# Patient Record
Sex: Female | Born: 1955 | Race: White | Hispanic: No | Marital: Single | Smoking: Current every day smoker
Health system: Southern US, Community
[De-identification: ages and names within clinical notes are randomized; demographics above are authoritative.]

## PROBLEM LIST (undated history)

## (undated) DIAGNOSIS — J45909 Unspecified asthma, uncomplicated: Secondary | ICD-10-CM

## (undated) DIAGNOSIS — R1310 Dysphagia, unspecified: Secondary | ICD-10-CM

## (undated) DIAGNOSIS — M329 Systemic lupus erythematosus, unspecified: Secondary | ICD-10-CM

## (undated) DIAGNOSIS — T8859XA Other complications of anesthesia, initial encounter: Secondary | ICD-10-CM

## (undated) DIAGNOSIS — Z87442 Personal history of urinary calculi: Secondary | ICD-10-CM

## (undated) DIAGNOSIS — IMO0002 Reserved for concepts with insufficient information to code with codable children: Secondary | ICD-10-CM

## (undated) DIAGNOSIS — R42 Dizziness and giddiness: Secondary | ICD-10-CM

## (undated) DIAGNOSIS — M069 Rheumatoid arthritis, unspecified: Secondary | ICD-10-CM

## (undated) DIAGNOSIS — F419 Anxiety disorder, unspecified: Secondary | ICD-10-CM

## (undated) DIAGNOSIS — J449 Chronic obstructive pulmonary disease, unspecified: Secondary | ICD-10-CM

## (undated) DIAGNOSIS — F32A Depression, unspecified: Secondary | ICD-10-CM

## (undated) DIAGNOSIS — I10 Essential (primary) hypertension: Secondary | ICD-10-CM

## (undated) HISTORY — PX: HEEL SPUR SURGERY: SHX665

## (undated) HISTORY — PX: LUMBAR FUSION: SHX111

## (undated) HISTORY — PX: CERVICAL FUSION: SHX112

## (undated) HISTORY — PX: CHOLECYSTECTOMY: SHX55

## (undated) HISTORY — PX: CATARACT EXTRACTION: SUR2

## (undated) HISTORY — PX: ROTATOR CUFF REPAIR: SHX139

---

## 1988-03-31 DIAGNOSIS — C539 Malignant neoplasm of cervix uteri, unspecified: Secondary | ICD-10-CM

## 1988-03-31 HISTORY — DX: Malignant neoplasm of cervix uteri, unspecified: C53.9

## 1988-03-31 HISTORY — PX: CERVICAL ABLATION: SHX5771

## 2002-03-31 DIAGNOSIS — I469 Cardiac arrest, cause unspecified: Secondary | ICD-10-CM

## 2002-03-31 HISTORY — DX: Cardiac arrest, cause unspecified: I46.9

## 2012-11-05 ENCOUNTER — Emergency Department: Payer: Self-pay | Admitting: Emergency Medicine

## 2012-11-14 ENCOUNTER — Emergency Department: Payer: Self-pay | Admitting: Internal Medicine

## 2012-11-28 ENCOUNTER — Emergency Department: Payer: Self-pay | Admitting: Emergency Medicine
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
## 2014-11-10 DIAGNOSIS — F418 Other specified anxiety disorders: Secondary | ICD-10-CM | POA: Insufficient documentation

## 2020-05-17 ENCOUNTER — Emergency Department: Payer: Medicaid - Out of State

## 2020-05-17 ENCOUNTER — Other Ambulatory Visit: Payer: Self-pay

## 2020-05-17 ENCOUNTER — Emergency Department
Admission: EM | Admit: 2020-05-17 | Discharge: 2020-05-17 | Disposition: A | Discharge status: Home / Self Care | Payer: Medicaid - Out of State | Attending: Emergency Medicine | Admitting: Emergency Medicine

## 2020-05-17 DIAGNOSIS — F17200 Nicotine dependence, unspecified, uncomplicated: Secondary | ICD-10-CM | POA: Insufficient documentation

## 2020-05-17 DIAGNOSIS — R42 Dizziness and giddiness: Secondary | ICD-10-CM

## 2020-05-17 DIAGNOSIS — I10 Essential (primary) hypertension: Secondary | ICD-10-CM | POA: Diagnosis not present

## 2020-05-17 HISTORY — DX: Essential (primary) hypertension: I10

## 2020-05-17 HISTORY — DX: Reserved for concepts with insufficient information to code with codable children: IMO0002

## 2020-05-17 HISTORY — DX: Dizziness and giddiness: R42

## 2020-05-17 HISTORY — DX: Systemic lupus erythematosus, unspecified: M32.9

## 2020-05-17 LAB — CBC
HCT: 41 % (ref 36.0–46.0)
Hemoglobin: 14.4 g/dL (ref 12.0–15.0)
MCH: 31.4 pg (ref 26.0–34.0)
MCHC: 35.1 g/dL (ref 30.0–36.0)
MCV: 89.5 fL (ref 80.0–100.0)
Platelets: 183 10*3/uL (ref 150–400)
RBC: 4.58 MIL/uL (ref 3.87–5.11)
RDW: 14.9 % (ref 11.5–15.5)
WBC: 5.7 10*3/uL (ref 4.0–10.5)
nRBC: 0 % (ref 0.0–0.2)

## 2020-05-17 LAB — URINALYSIS, COMPLETE (UACMP) WITH MICROSCOPIC
Bacteria, UA: NONE SEEN
Bilirubin Urine: NEGATIVE
Glucose, UA: NEGATIVE mg/dL
Hgb urine dipstick: NEGATIVE
Ketones, ur: NEGATIVE mg/dL
Leukocytes,Ua: NEGATIVE
Nitrite: NEGATIVE
Protein, ur: 30 mg/dL — AB
Specific Gravity, Urine: 1.02 (ref 1.005–1.030)
pH: 6 (ref 5.0–8.0)

## 2020-05-17 LAB — BASIC METABOLIC PANEL
Anion gap: 6 (ref 5–15)
BUN: 22 mg/dL (ref 8–23)
CO2: 25 mmol/L (ref 22–32)
Calcium: 9.3 mg/dL (ref 8.9–10.3)
Chloride: 106 mmol/L (ref 98–111)
Creatinine, Ser: 1.13 mg/dL — ABNORMAL HIGH (ref 0.44–1.00)
GFR, Estimated: 55 mL/min — ABNORMAL LOW (ref >60)
Glucose, Bld: 115 mg/dL — ABNORMAL HIGH (ref 70–99)
Potassium: 4.3 mmol/L (ref 3.5–5.1)
Sodium: 137 mmol/L (ref 135–145)

## 2020-05-17 MED ORDER — SODIUM CHLORIDE 0.9 % IV BOLUS
1000.0000 mL | Freq: Once | INTRAVENOUS | Status: AC
  Administered 2020-05-17: 1000 mL via INTRAVENOUS

## 2020-05-17 MED ORDER — MECLIZINE HCL 25 MG PO TABS: 25.0000 mg | ORAL_TABLET | Freq: Three times a day (TID) | ORAL | 1 refills | Status: DC | PRN

## 2020-05-17 NOTE — ED Provider Notes (Signed)
[facility] Emergency Department Provider Note  ____________________________________________  Time seen: Approximately 2:42 PM  I have reviewed the triage vital signs and the nursing notes.   HISTORY  Chief Complaint Dizziness    HPI Melanie Hubbard is a 65 y.o. female with a history of hypertension lupus and vertigo who comes ED complaining of dizziness and change in balance over the last 2 or 3 days, waxing waning, worse with standing and first getting out of bed in the morning, no alleviating factors.  Denies palpitations chest pain shortness of breath headache vision changes paresthesias or motor weakness.   She is normally on enalapril for blood pressure, has not taken it in the last few days.  Recently moved to this area from [place], does not have a PCP yet because she is waiting for Medicaid to be transferred.   Past Medical History:  Diagnosis Date  . Hypertension   . Lupus ([place])   . Vertigo      There are no problems to display for this patient.    History reviewed. No pertinent surgical history.   Prior to Admission medications   Medication Sig Start Date End Date Taking? Authorizing Provider  meclizine (ANTIVERT) 25 MG tablet Take 1 tablet (25 mg total) by mouth 3 (three) times daily as needed for dizziness or nausea. 05/17/20  Yes Carrie Mew, MD     Allergies Patient has no known allergies.   No family history on file.  Social History Social History   Tobacco Use  . Smoking status: Current Every Day Smoker    Packs/day: 0.50  . Smokeless tobacco: Never Used    Review of Systems  Constitutional:   No fever or chills.  ENT:   No sore throat. No rhinorrhea. Cardiovascular:   No chest pain or syncope. Respiratory:   No dyspnea or cough. Gastrointestinal:   Negative for abdominal pain, vomiting and diarrhea.  Musculoskeletal:   Negative for focal pain or swelling All other systems reviewed and are negative except  as documented above in ROS and HPI.  ____________________________________________   PHYSICAL EXAM:  VITAL SIGNS: ED Triage Vitals  Enc Vitals Group     BP 05/17/20 1138 (!) 138/112     Pulse Rate 05/17/20 1138 65     Resp 05/17/20 1138 16     Temp 05/17/20 1138 98.2 F (36.8 C)     Temp Source 05/17/20 1138 Oral     SpO2 05/17/20 1138 98 %     Weight 05/17/20 1137 250 lb (113.4 kg)     Height 05/17/20 1137 5' (1.524 m)     Head Circumference --      Peak Flow --      Pain Score 05/17/20 1137 0     Pain Loc --      Pain Edu? --      Excl. in [place]? --     Vital signs reviewed, nursing assessments reviewed.   Constitutional:   Alert and oriented. Non-toxic appearance. Eyes:   Conjunctivae are normal. EOMI. PERRL.  No nystagmus ENT      Head:   Normocephalic and atraumatic.      Nose:   Wearing a mask.      Mouth/Throat:   Wearing a mask.      Neck:   No meningismus. Full ROM. Hematological/Lymphatic/Immunilogical:   No cervical lymphadenopathy. Cardiovascular:   RRR. Symmetric bilateral radial and DP pulses.  No murmurs. Cap refill less than 2 seconds. Respiratory:  Normal respiratory effort without tachypnea/retractions. Breath sounds are clear and equal bilaterally. No wheezes/rales/rhonchi. Gastrointestinal:   Soft and nontender. Non distended. There is no CVA tenderness.  No rebound, rigidity, or guarding.  Musculoskeletal:   Normal range of motion in all extremities. No joint effusions.  No lower extremity tenderness.  No edema. Neurologic:   Normal speech and language.  Motor grossly intact. Slightly off balance with ambulation and turning Skin:    Skin is warm, dry and intact. No rash noted.  No petechiae, purpura, or bullae.  ____________________________________________    LABS (pertinent positives/negatives) (all labs ordered are listed, but only abnormal results are displayed) Labs Reviewed  BASIC METABOLIC PANEL - Abnormal; Notable for the following  components:      Result Value   Glucose, Bld 115 (*)    Creatinine, Ser 1.13 (*)    GFR, Estimated 55 (*)    All other components within normal limits  URINALYSIS, COMPLETE (UACMP) WITH MICROSCOPIC - Abnormal; Notable for the following components:   Color, Urine YELLOW (*)    APPearance HAZY (*)    Protein, ur 30 (*)    All other components within normal limits  CBC   ____________________________________________   EKG    ____________________________________________    RADIOLOGY  CT Head Wo Contrast  Result Date: 05/17/2020 CLINICAL DATA:  65 year old female with history of dizziness. EXAM: CT HEAD WITHOUT CONTRAST TECHNIQUE: Contiguous axial images were obtained from the base of the skull through the vertex without intravenous contrast. COMPARISON:  Head CT 11/28/2012. FINDINGS: Brain: No evidence of acute infarction, hemorrhage, hydrocephalus, extra-axial collection or mass lesion/mass effect. Vascular: No hyperdense vessel or unexpected calcification. Skull: Normal. Negative for fracture or focal lesion. Sinuses/Orbits: No acute finding. Other: None. IMPRESSION: 1. No acute intracranial abnormalities. The appearance of the brain is normal. Electronically Signed   By: Vinnie Langton M.D.   On: 05/17/2020 13:02    ____________________________________________   PROCEDURES Procedures  ____________________________________________  DIFFERENTIAL DIAGNOSIS   Intracranial hemorrhage, dehydration, peripheral vertigo, symptomatic hypertension  CLINICAL IMPRESSION / ASSESSMENT AND PLAN / ED COURSE  Medications ordered in the ED: Medications  sodium chloride 0.9 % bolus 1,000 mL (1,000 mLs Intravenous New Bag/Given 05/17/20 1307)    Pertinent labs & imaging results that were available during my care of the patient were reviewed by me and considered in my medical decision making (see chart for details).  Melanie Hubbard was evaluated in Emergency Department on 05/17/2020 for  the symptoms described in the history of present illness. She was evaluated in the context of the global COVID-19 pandemic, which necessitated consideration that the patient might be at risk for infection with the SARS-CoV-2 virus that causes COVID-19. Institutional protocols and algorithms that pertain to the evaluation of patients at risk for COVID-19 are in a state of rapid change based on information released by regulatory bodies including the CDC and federal and state organizations. These policies and algorithms were followed during the patient's care in the ED.   Patient presents with dizziness with standing over the past few days.  Initial neuro exam overall unremarkable except for slight loss of balance with ambulation.  CT scan obtained which is negative.  Labs normal, vital signs overall unremarkable except for mild hypertension attributable to her noncompliance with enalapril.  Patient given IV fluids, feeling better, ambulating without difficulty.  Vital signs remained stable, neuro exam remains intact.  Stable for discharge home, referral to outpatient primary care resources.  Meclizine as needed.  Considering the patient's symptoms, medical history, and physical examination today, I have low suspicion for ischemic stroke, intracranial hemorrhage, meningitis, encephalitis, carotid or vertebral dissection, venous sinus thrombosis, MS, intracranial hypertension, glaucoma, CRAO, CRVO, or temporal arteritis.       ____________________________________________   FINAL CLINICAL IMPRESSION(S) / ED DIAGNOSES    Final diagnoses:  Dizziness     ED Discharge Orders         Ordered    meclizine (ANTIVERT) 25 MG tablet  3 times daily PRN        05/17/20 1442          Portions of this note were generated with dragon dictation software. Dictation errors may occur despite best attempts at proofreading.   Carrie Mew, MD 05/17/20 1447

## 2020-05-17 NOTE — Discharge Instructions (Signed)
Your lab tests and CT scan of the head were normal today.

## 2020-05-17 NOTE — ED Triage Notes (Signed)
Pt states that she has been having dizziness and losing her balance- pt has a hx of vertigo- pt has not taken her bp meds for the past couple days

## 2020-10-14 ENCOUNTER — Other Ambulatory Visit: Payer: Self-pay

## 2020-10-14 ENCOUNTER — Emergency Department
Admission: EM | Admit: 2020-10-14 | Discharge: 2020-10-14 | Disposition: A | Discharge status: Home / Self Care | Payer: Medicaid Other | Attending: Emergency Medicine | Admitting: Emergency Medicine

## 2020-10-14 ENCOUNTER — Emergency Department: Payer: Medicaid Other

## 2020-10-14 ENCOUNTER — Encounter: Payer: Self-pay | Admitting: Emergency Medicine

## 2020-10-14 DIAGNOSIS — Z79899 Other long term (current) drug therapy: Secondary | ICD-10-CM | POA: Diagnosis not present

## 2020-10-14 DIAGNOSIS — N200 Calculus of kidney: Secondary | ICD-10-CM

## 2020-10-14 DIAGNOSIS — M545 Low back pain, unspecified: Secondary | ICD-10-CM | POA: Diagnosis present

## 2020-10-14 DIAGNOSIS — G8929 Other chronic pain: Secondary | ICD-10-CM

## 2020-10-14 DIAGNOSIS — I10 Essential (primary) hypertension: Secondary | ICD-10-CM | POA: Insufficient documentation

## 2020-10-14 DIAGNOSIS — F17210 Nicotine dependence, cigarettes, uncomplicated: Secondary | ICD-10-CM | POA: Diagnosis not present

## 2020-10-14 LAB — BASIC METABOLIC PANEL
Anion gap: 6 (ref 5–15)
BUN: 19 mg/dL (ref 8–23)
CO2: 27 mmol/L (ref 22–32)
Calcium: 9.3 mg/dL (ref 8.9–10.3)
Chloride: 105 mmol/L (ref 98–111)
Creatinine, Ser: 0.91 mg/dL (ref 0.44–1.00)
GFR, Estimated: >60 mL/min (ref >60)
Glucose, Bld: 97 mg/dL (ref 70–99)
Potassium: 4.2 mmol/L (ref 3.5–5.1)
Sodium: 138 mmol/L (ref 135–145)

## 2020-10-14 LAB — URINALYSIS, COMPLETE (UACMP) WITH MICROSCOPIC
Bacteria, UA: NONE SEEN
Bilirubin Urine: NEGATIVE
Glucose, UA: NEGATIVE mg/dL
Ketones, ur: NEGATIVE mg/dL
Nitrite: NEGATIVE
Protein, ur: NEGATIVE mg/dL
Specific Gravity, Urine: 1.018 (ref 1.005–1.030)
pH: 6 (ref 5.0–8.0)

## 2020-10-14 LAB — CBC
HCT: 42.7 % (ref 36.0–46.0)
Hemoglobin: 14.3 g/dL (ref 12.0–15.0)
MCH: 30.4 pg (ref 26.0–34.0)
MCHC: 33.5 g/dL (ref 30.0–36.0)
MCV: 90.7 fL (ref 80.0–100.0)
Platelets: 194 10*3/uL (ref 150–400)
RBC: 4.71 MIL/uL (ref 3.87–5.11)
RDW: 14.2 % (ref 11.5–15.5)
WBC: 7.6 10*3/uL (ref 4.0–10.5)
nRBC: 0 % (ref 0.0–0.2)

## 2020-10-14 MED ORDER — PREDNISONE 20 MG PO TABS: 40.0000 mg | ORAL_TABLET | Freq: Every day | ORAL | 0 refills | Status: AC

## 2020-10-14 MED ORDER — KETOROLAC TROMETHAMINE 30 MG/ML IJ SOLN
30.0000 mg | Freq: Once | INTRAMUSCULAR | Status: AC
  Administered 2020-10-14: 30 mg via INTRAMUSCULAR
  Filled 2020-10-14: qty 1

## 2020-10-14 NOTE — ED Triage Notes (Signed)
Pt reports back pain to her lower back for a long time. Pt states she needs spinal surgery but she can't have it done.  Pt also reports her surgeon took x-rays and told her she had a kidney stone and she is not sure what the pain is coming from but it has gotten worse.

## 2020-10-14 NOTE — Discharge Instructions (Signed)
Your exam is normal and your CT scan shows a large, stable, nonobstructing kidney stone measuring 2.3 cm. You should follow-up with Urology for further management.

## 2020-10-14 NOTE — ED Provider Notes (Signed)
[facility] Emergency Department Provider Note  ____________________________________________   Event Date/Time   First MD Initiated Contact with Patient 10/14/20 1646     (approximate)  I have reviewed the triage vital signs and the nursing notes.   HISTORY  Chief Complaint Back Pain and Flank Pain  HPI Melanie Hubbard is a 65 y.o. female with a history of hypertension, lupus, kidney stones, and chronic low back pain with sciatica bilaterally, presents to the ED for evaluation of ongoing right-sided low back pain.  Patient was recently evaluated by [place] for her back pain, to establish care.  According to the chart review, patient will have follow-up once her previous MRI images are able to be reviewed.  X-rays in the office performed on Friday, do reveal a large right kidney stone.  Patient currently denies any hematuria, dysuria, urinary retention, fever, chills, or sweats.  She was advised to follow-up in the outpatient setting with urology related to the kidney stone.  She presents to the ED today, with request for pain medicine secondary to her low back pain.  Patient was apparently provided a small one-time prescription for oxycodone at her visit 3 days prior.    Past Medical History:  Diagnosis Date   Hypertension    Lupus ([place])    Vertigo     There are no problems to display for this patient.   History reviewed. No pertinent surgical history.  Prior to Admission medications   Medication Sig Start Date End Date Taking? Authorizing Provider  cyclobenzaprine (FLEXERIL) 10 MG tablet Take 10 mg by mouth 3 (three) times daily as needed for muscle spasms.   Yes [provider]  enalapril (VASOTEC) 20 MG tablet Take 20 mg by mouth daily.   Yes [provider]  gabapentin (NEURONTIN) 600 MG tablet Take 600 mg by mouth 3 (three) times daily.   Yes [provider]  oxyCODONE (OXY IR/ROXICODONE) 5 MG immediate release  tablet Take 20 mg by mouth every 4 (four) hours as needed for severe pain.   Yes [provider]  predniSONE (DELTASONE) 20 MG tablet Take 2 tablets (40 mg total) by mouth daily with breakfast for 5 days. 10/14/20 10/19/20 Yes Shalla Bulluck, Dannielle Karvonen, PA-C  meclizine (ANTIVERT) 25 MG tablet Take 1 tablet (25 mg total) by mouth 3 (three) times daily as needed for dizziness or nausea. 05/17/20   Carrie Mew, MD    Allergies Patient has no known allergies.  History reviewed. No pertinent family history.  Social History Social History   Tobacco Use   Smoking status: Every Day    Packs/day: 0.50    Types: Cigarettes   Smokeless tobacco: Never    Review of Systems  Constitutional: No fever/chills Eyes: No visual changes. ENT: No sore throat. Cardiovascular: Denies chest pain. Respiratory: Denies shortness of breath. Gastrointestinal: No abdominal pain.  No nausea, no vomiting.  No diarrhea.  No constipation. Genitourinary: Negative for dysuria, hematuria, urinary retention. Musculoskeletal: Positive for back pain. Skin: Negative for rash. Neurological: Negative for headaches, focal weakness or numbness. ____________________________________________   PHYSICAL EXAM:  VITAL SIGNS: ED Triage Vitals  Enc Vitals Group     BP 10/14/20 1342 (!) 165/92     Pulse Rate 10/14/20 1342 63     Resp 10/14/20 1342 20     Temp 10/14/20 1342 98.7 F (37.1 C)     Temp Source 10/14/20 1342 Oral     SpO2 10/14/20 1342 96 %  Weight 10/14/20 1336 210 lb (95.3 kg)     Height 10/14/20 1336 5' (1.524 m)     Head Circumference --      Peak Flow --      Pain Score 10/14/20 1336 9     Pain Loc --      Pain Edu? --      Excl. in [place]? --     Constitutional: Alert and oriented. Well appearing and in no acute distress. Eyes: Conjunctivae are normal. PERRL. EOMI. Head: Atraumatic. Nose: No congestion/rhinnorhea. Mouth/Throat: Mucous membranes are moist.  Oropharynx  non-erythematous. Neck: No stridor.   Cardiovascular: Normal rate, regular rhythm. Grossly normal heart sounds.  Good peripheral circulation. Respiratory: Normal respiratory effort.  No retractions. Lungs CTAB. Gastrointestinal: Soft and nontender. No distention. No abdominal bruits. No CVA tenderness. Musculoskeletal: No lower extremity tenderness nor edema.  No joint effusions. Neurologic:  Normal speech and language. No gross focal neurologic deficits are appreciated. No gait instability. Skin:  Skin is warm, dry and intact. No rash noted. Psychiatric: Mood and affect are normal. Speech and behavior are normal.  ____________________________________________   LABS (all labs ordered are listed, but only abnormal results are displayed)  Labs Reviewed  URINALYSIS, COMPLETE (UACMP) WITH MICROSCOPIC - Abnormal; Notable for the following components:      Result Value   Color, Urine YELLOW (*)    APPearance HAZY (*)    Hgb urine dipstick MODERATE (*)    Leukocytes,Ua SMALL (*)    All other components within normal limits  BASIC METABOLIC PANEL  CBC   ____________________________________________  EKG   ____________________________________________  RADIOLOGY I, Melvenia Needles, personally viewed and evaluated these images (plain radiographs) as part of my medical decision making, as well as reviewing the written report by the radiologist.  ED MD interpretation:  agree with report  Official radiology report(s):  CT Renal Stone Study  IMPRESSION: 1. Nonobstructing 2.3 cm calculus in the right renal pelvis. 2. Heterogeneous liver attenuation likely representing underlying hepatic steatosis. 3. Diverticulosis of the descending and sigmoid colon. There are no acute inflammatory changes at this time, but wall thickening of the sigmoid colon suggests scarring from previous bouts of diverticulitis. 4.  Aortic Atherosclerosis  (ICD10-I70.0).  ____________________________________________   PROCEDURES  Procedure(s) performed (including Critical Care):  Procedures  Toradol 30 mg IM  ____________________________________________   INITIAL IMPRESSION / ASSESSMENT AND PLAN / ED COURSE  As part of my medical decision making, I reviewed the following data within the [place] reviewed WNL, Radiograph reviewed as noted, Notes from prior ED visits, and [place] Controlled Substance Database      DDX: chronic LBP, nephrolithiasis, pyelonephritis, radiculopathy  Patient ED evaluation of chronic lumbar pain, superimposed with a recently found large right renal stone.  Patient without any dysuria on presentation, and no laboratory evidence of acute pyelonephritis or obstructive nephrolithiasis.  Patient with a large renal stone in the renal pelvis.  Patient otherwise stable at this time patient discharged with prescription for prednisone to take as directed.  She will follow-up with specialist for ongoing pain management.  She is also referred to her pain and spinal surgeon for intervention.  She will be referred to urology for management of her kidney stone.  Return precautions have been reviewed.  ____________________________________________   FINAL CLINICAL IMPRESSION(S) / ED DIAGNOSES  Final diagnoses:  Chronic bilateral low back pain with bilateral sciatica  Kidney stone     ED Discharge Orders  Ordered    predniSONE (DELTASONE) 20 MG tablet  Daily with breakfast        10/14/20 1758             Note:  This document was prepared using Dragon voice recognition software and may include unintentional dictation errors.    Melvenia Needles, PA-C 10/15/20 1926    Arta Silence, MD 10/23/20 0700

## 2020-10-14 NOTE — ED Notes (Signed)
Pt given cup for urine sample 

## 2020-10-16 ENCOUNTER — Ambulatory Visit: Payer: Self-pay | Admitting: Urology

## 2020-10-17 ENCOUNTER — Encounter: Payer: Self-pay | Admitting: Urology

## 2020-10-17 ENCOUNTER — Ambulatory Visit (INDEPENDENT_AMBULATORY_CARE_PROVIDER_SITE_OTHER): Payer: Medicaid Other | Admitting: Urology

## 2020-10-17 ENCOUNTER — Other Ambulatory Visit: Payer: Self-pay

## 2020-10-17 VITALS — BP 156/89 | HR 78 | Ht 60.0 in | Wt 211.0 lb

## 2020-10-17 DIAGNOSIS — R109 Unspecified abdominal pain: Secondary | ICD-10-CM | POA: Diagnosis not present

## 2020-10-17 DIAGNOSIS — N200 Calculus of kidney: Secondary | ICD-10-CM

## 2020-10-17 DIAGNOSIS — N261 Atrophy of kidney (terminal): Secondary | ICD-10-CM | POA: Diagnosis not present

## 2020-10-17 NOTE — Patient Instructions (Signed)
Percutaneous Nephrolithotomy Percutaneous nephrolithotomy is a procedure to remove kidney stones. Kidney stones are deposits that form inside your kidneys and can cause pain. You may need this procedure if: You have large kidney stones. Kidney stones that are bigger than 2 cm (0.78 in.) wide may require this procedure. Your kidney stones are oddly shaped. Other treatments have not been successful in helping the kidney stones to pass. You have developed an infection due to the kidney stones. Tell a health care provider about: Any allergies you have. All medicines you are taking, including vitamins, herbs, eye drops, creams, and over-the-counter medicines. Any problems you or family members have had with anesthetic medicines. Any blood disorders you have. Any surgeries you have had. Any medical conditions you have. Whether you are pregnant or may be pregnant. Whether you use any tobacco products, including cigarettes, chewing tobacco, or e-cigarettes. What are the risks? Generally, this is a safe procedure. However, problems may occur, including: Infection. Bleeding. This may include blood in your urine. Allergic reactions to medicines. Damage to other structures or organs. Kidney damage. Holes in the kidney. These often heal on their own. Numbness or tingling in the affected area. Inability to remove all the stones. You may need a different procedure to complete treatment. What happens before the procedure? Staying hydrated Follow instructions from your health care provider about hydration, which may include: Up to 2 hours before the procedure - you may continue to drink clear liquids, such as water, clear fruit juice, black coffee, and plain tea.  Eating and drinking restrictions Follow instructions from your health care provider about eating and drinking, which may include: 8 hours before the procedure - stop eating heavy meals or foods, such as meat, fried foods, or fatty foods. 6  hours before the procedure - stop eating light meals or foods, such as toast or cereal. 6 hours before the procedure - stop drinking milk or drinks that contain milk. 2 hours before the procedure - stop drinking clear liquids. Medicines Ask your health care provider about: Changing or stopping your regular medicines. This is especially important if you are taking diabetes medicines or blood thinners. Taking medicines such as aspirin and ibuprofen. These medicines can thin your blood. Do not take these medicines unless your health care provider tells you to take them. Taking over-the-counter medicines, vitamins, herbs, and supplements. Tests You may have tests, including: Blood tests. Urine tests. Tests to check how your heart is working. Imaging studies. These are used to identify: The size and number (stone burden) of the kidney stones. The position of the kidney stones. General instructions Plan to have someone take you home from the hospital or clinic. Plan to have a responsible adult care for you for at least 24 hours after you leave the hospital or clinic. This is important. Ask your health care provider how your surgical site will be marked or identified. Ask your health care provider what steps will be taken to help prevent infection. These may include: Removing hair at the surgery site. Washing skin with a germ-killing soap. Taking antibiotic medicine. What happens during the procedure?  An IV will be inserted into one of your veins. The site of the procedure will be marked. You will be given one or more of the following: A medicine to help you relax (sedative). A medicine to numb the area (local anesthetic). A medicine to make you fall asleep (general anesthetic). A medicine that is injected into your spine to numb the area below  and slightly above the injection site (spinal anesthetic). A medicine that is injected into an area of your body to numb everything below the  injection site (regional anesthetic). A thin tube (urinary catheter) will be put in your bladder to drain urine during and after the procedure. Your surgeon will make a small cut (incision) in your lower back. A tube will be inserted through the incision into your kidney. Each kidney stone will be removed through this tube. Larger stones may need to be broken up with a high-intensity light beam (laser) or other tools. After all of the stones have been removed, your health care provider may put in tubes to drain your bladder. Based on your condition: An internal tube, called a stent, may be put in your ureter. This will help drain urine from your kidney to your bladder. A surgical drain (nephrostomy tube) may be put in your kidney. The tube comes out through the incision in your lower back. This will help to drain urine or any fluid that builds up while your kidney heals. Part of the incision may be closed with stitches (sutures). A bandage (dressing) will be placed over the incision area. The procedure may vary among health care providers and hospitals. What happens after the procedure? Your blood pressure, heart rate, breathing rate, and blood oxygen level will be monitored until you leave the hospital or clinic. You may be given medicine for pain. You will be shown how to do breathing exercises, such as coughing and breathing deeply. These will help to prevent pneumonia. You will be encouraged to walk. Walking helps to prevent blood clots. Your stent and urinary catheter will be removed after 1-2 days if there is only a small amount of blood in your urine. You will be taught how to care for the catheter or nephrostomy tube, if you have them. Do not drive for 24 hours if you were given a sedative during your procedure. Summary Percutaneous nephrolithotomy is a procedure to remove kidney stones. Ask your health care provider about changing or stopping your regular medicines. Before surgery,  follow instructions from your health care provider about eating and drinking. Plan to have someone take you home from the hospital or clinic. This information is not intended to replace advice given to you by your health care provider. Make sure you discuss any questions you have with your healthcare provider. Document Revised: 05/13/2018 Document Reviewed: 10/07/2017 Elsevier Patient Education  [employer].

## 2020-10-17 NOTE — Progress Notes (Signed)
10/17/2020 12:52 PM   Melanie Hubbard November 11, 1955 [id number]  Referring provider: No referring provider defined for this encounter.  Chief Complaint  Patient presents with   Nephrolithiasis    HPI: 65 year old female who presents today for further evaluation of chronic right flank/ low back pain pain found to have a nonobstructing 2.3 cm right renal pelvic stone.  She was seen and evaluated in the emergency room on 10/14/2020 with chronic flank pain.  This was worsening.  She underwent noncontrast CT scan indicating a large 2.3 cm right renal pelvic stone with out hydronephrosis.  We have no previous imaging for comparison.  There is some chronic atrophy in the right kidney.  I personally reviewed these images today.  She does have multiple medical comorbidities including a personal history of lupus.  Was is currently not being treated.  She does not have a rheumatologist.  She reports that she recently moved to [place] and is reestablishing here.  She is seen [facility] as her primary care physician but is yet to establish with a rheumatologist because she has declined medications for her lupus in the past due to concern for vision loss.  Most recent creatinine 1.13 she does report that she has had recurrence like acute kidney injury on multiple occasions.  She does have a personal history of kidney stones.  She reports that she saw a urologist in [place] a few years ago.  Into be treated for stone but when they went to remove it, it had "disappeared".  She does have some urinary frequency otherwise no dysuria or gross hematuria.  No fevers or chills.  She is requesting narcotics today.  PMH: Past Medical History:  Diagnosis Date   Hypertension    Lupus (Tony)    Vertigo     Surgical History: History reviewed. No pertinent surgical history.  Home Medications:  Allergies as of 10/17/2020       Reactions   Latex Itching        Medication List         Accurate as of October 17, 2020 12:52 PM. If you have any questions, ask your nurse or doctor.          cyclobenzaprine 10 MG tablet Commonly known as: FLEXERIL Take 10 mg by mouth 3 (three) times daily as needed for muscle spasms.   enalapril 20 MG tablet Commonly known as: VASOTEC Take 20 mg by mouth daily.   gabapentin 600 MG tablet Commonly known as: NEURONTIN Take 600 mg by mouth 3 (three) times daily.   meclizine 25 MG tablet Commonly known as: ANTIVERT Take 1 tablet (25 mg total) by mouth 3 (three) times daily as needed for dizziness or nausea.   oxyCODONE 5 MG immediate release tablet Commonly known as: Oxy IR/ROXICODONE Take 20 mg by mouth every 4 (four) hours as needed for severe pain.   predniSONE 20 MG tablet Commonly known as: DELTASONE Take 2 tablets (40 mg total) by mouth daily with breakfast for 5 days.        Allergies:  Allergies  Allergen Reactions   Latex Itching    Family History: History reviewed. No pertinent family history.  Social History:  reports that she has been smoking cigarettes. She has been smoking an average of .5 packs per day. She has never used smokeless tobacco. No history on file for alcohol use and drug use.   Physical Exam: BP (!) 156/89   Pulse 78   Ht 5' (1.524  m)   Wt 211 lb (95.7 kg)   BMI 41.21 kg/m   Constitutional:  Alert and oriented, No acute distress. HEENT: [place] AT, moist mucus membranes.  Trachea midline, no masses. Cardiovascular: No clubbing, cyanosis, or edema. Respiratory: Normal respiratory effort, no increased work of breathing. Skin: No rashes, bruises or suspicious lesions. Neurologic: Grossly intact, no focal deficits, moving all 4 extremities. Psychiatric: Normal mood and affect.  Laboratory Data: Lab Results  Component Value Date   WBC 7.6 10/14/2020   HGB 14.3 10/14/2020   HCT 42.7 10/14/2020   MCV 90.7 10/14/2020   PLT 194 10/14/2020    Lab Results  Component Value Date   CREATININE  0.91 10/14/2020    Urinalysis Urinalysis today consistent with chronic stone, 6-10 WBCs, 3-10 RBCs, few bacteria, nitrate negative.  Pertinent Imaging:  CT Renal Stone Study  Narrative CLINICAL DATA:  Chronic lower back pain, nephrolithiasis  EXAM: CT ABDOMEN AND PELVIS WITHOUT CONTRAST  TECHNIQUE: Multidetector CT imaging of the abdomen and pelvis was performed following the standard protocol without IV contrast.  COMPARISON:  None.  FINDINGS: Lower chest: No acute pleural or parenchymal lung disease. Dense calcification of the mitral annulus.  Hepatobiliary: Unenhanced imaging of the liver demonstrates mild heterogeneous attenuation which may reflect underlying steatosis. Gallbladder surgically absent. No biliary duct dilation.  Pancreas: Unremarkable. No pancreatic ductal dilatation or surrounding inflammatory changes.  Spleen: Normal in size without focal abnormality.  Adrenals/Urinary Tract: There is a 2.3 x 2.2 x 2.2 cm calculus within the right renal pelvis. No evidence of right-sided hydronephrosis.  The left kidney is unremarkable. Bladder is decompressed, which limits evaluation. The adrenals are unremarkable.  Stomach/Bowel: No bowel obstruction or ileus. Normal appendix right lower quadrant. Diffuse diverticulosis of the descending and sigmoid colon. Mild wall thickening of the sigmoid colon may reflect scarring from previous bouts of diverticulitis. No inflammatory changes to suggest acute diverticulitis.  Vascular/Lymphatic: Aortic atherosclerosis. No enlarged abdominal or pelvic lymph nodes.  Reproductive: Status post hysterectomy. No adnexal masses.  Other: No free fluid or free gas.  No abdominal wall hernia.  Musculoskeletal: No acute or destructive bony lesions. Postsurgical changes are seen at L4-5. Reconstructed images demonstrate no additional findings.  IMPRESSION: 1. Nonobstructing 2.3 cm calculus in the right renal pelvis. 2.  Heterogeneous liver attenuation likely representing underlying hepatic steatosis. 3. Diverticulosis of the descending and sigmoid colon. There are no acute inflammatory changes at this time, but wall thickening of the sigmoid colon suggests scarring from previous bouts of diverticulitis. 4.  Aortic Atherosclerosis (ICD10-I70.0).   Electronically Signed By: Randa Ngo M.D. On: 10/14/2020 17:26  Above CT scan was personally reviewed.  Hounsfield units approximately 1250.  There is fairly significant right renal atrophy.  Assessment & Plan:    1. Right Kidney stone Based on size, location, as well as right renal atrophy, this is likely a very chronic stone  Based on the size and location of the stone, I have strongly recommended right PCNL.  We discussed that she does have some fairly significant atrophy compared to the left but with her history of lupus, we would like the most conservative approach and try to spare as many nephrons as possible.    We discussed PCNL at length.  She is already done some reading about this in anticipation of her visit today.  She understands the preoperative, intraoperative and postoperative course.  She understands the need for nephrostomy tube, possible ureteral stent and Foley postop with at least observation  admission.  We discussed the risk of bleeding as well as the 5% risk of need for blood transfusion.  We discussed the risk of infection, damage surrounding structures, need for staged or multiple procedures amongst others.  All questions were answered.  Preoperative UA/urine culture - Urinalysis, Complete - CULTURE, URINE COMPREHENSIVE  2. Right renal atrophy Likely secondary to #1 see discussion  3. Right flank pain Patient is requesting narcotics today, however given that the stone is nonobstructing and very chronic, I am hesitant to prescribe these today.  She understands and will utilize medical marijuana that she has from [place] of state  physician to help with pain in the interim.  She understands that medical marijuana is currently not legal in [place].  Schedule right PCNL   Hollice Espy, MD  [facility] [address], [place],  [address] [phone]

## 2020-10-18 LAB — MICROSCOPIC EXAMINATION

## 2020-10-18 LAB — URINALYSIS, COMPLETE
Bilirubin, UA: NEGATIVE
Glucose, UA: NEGATIVE
Ketones, UA: NEGATIVE
Leukocytes,UA: NEGATIVE
Nitrite, UA: NEGATIVE
Specific Gravity, UA: >1.03 — ABNORMAL HIGH (ref 1.005–1.030)
Urobilinogen, Ur: 2 mg/dL — ABNORMAL HIGH (ref 0.2–1.0)
pH, UA: 5.5 (ref 5.0–7.5)

## 2020-10-19 LAB — CULTURE, URINE COMPREHENSIVE

## 2020-11-06 ENCOUNTER — Telehealth: Payer: Self-pay | Admitting: Urology

## 2020-11-06 ENCOUNTER — Other Ambulatory Visit: Payer: Self-pay | Admitting: Urology

## 2020-11-06 DIAGNOSIS — N200 Calculus of kidney: Secondary | ICD-10-CM

## 2020-11-06 NOTE — Telephone Encounter (Signed)
I called pt. To discuss pre-surgery instructions. Pt. Request pain medication.

## 2020-11-06 NOTE — Telephone Encounter (Signed)
Left patient a VM to return my call

## 2020-12-07 ENCOUNTER — Inpatient Hospital Stay
Admission: RE | Admit: 2020-12-07 | Discharge: 2020-12-07 | Disposition: A | Discharge status: Home / Self Care | Payer: Medicaid Other | Source: Ambulatory Visit

## 2020-12-07 ENCOUNTER — Telehealth: Payer: Self-pay | Admitting: Urology

## 2020-12-07 NOTE — Telephone Encounter (Signed)
I was contacted by Preadmit testing today because they are unable to reach patient. I tried to call pt. However the only phone number on file for the pt is no longer in service and her emergency contact's phone number goes straight to voicemail and I left a message to have pt. Or himself call our office to get her preop appointment rescheduled.

## 2020-12-07 NOTE — Patient Instructions (Signed)
Your procedure is scheduled on: Monday, September 19 Report to the Registration Desk on the 1st floor of the [facility]. To find out your arrival time, please call [phone] between 1PM - 3PM on: Friday, September 16  REMEMBER: Instructions that are not followed completely may result in serious medical risk, up to and including death; or upon the discretion of your surgeon and anesthesiologist your surgery may need to be rescheduled.  Do not eat or drink after midnight the night before surgery.  No gum chewing, lozengers or hard candies.  TAKE THESE MEDICATIONS THE MORNING OF SURGERY WITH A SIP OF WATER:  Gabapentin Oxycodone if needed for pain  One week prior to surgery: starting September 12 Stop Anti-inflammatories (NSAIDS) such as Advil, Aleve, Ibuprofen, Motrin, Naproxen, Naprosyn and Aspirin based products such as Excedrin, Goodys Powder, BC Powder. Stop ANY OVER THE COUNTER supplements until after surgery. You may however, continue to take Tylenol if needed for pain up until the day of surgery.  No Alcohol for 24 hours before or after surgery.  No Smoking including e-cigarettes for 24 hours prior to surgery.  No chewable tobacco products for at least 6 hours prior to surgery.  No nicotine patches on the day of surgery.  Do not use any "recreational" drugs for at least a week prior to your surgery.  Please be advised that the combination of cocaine and anesthesia may have negative outcomes, up to and including death. If you test positive for cocaine, your surgery will be cancelled.  On the morning of surgery brush your teeth with toothpaste and water, you may rinse your mouth with mouthwash if you wish. Do not swallow any toothpaste or mouthwash.  Do not wear jewelry, make-up, hairpins, clips or nail polish.  Do not wear lotions, powders, or perfumes.   Do not shave body from the neck down 48 hours prior to surgery just in case you cut yourself which could leave a  site for infection.  Also, freshly shaved skin may become irritated if using the CHG soap.  Contact lenses, hearing aids and dentures may not be worn into surgery.  Do not bring valuables to the hospital. [facility] is not responsible for any missing/lost belongings or valuables.   Use CHG Soap as directed on instruction sheet.  Bring your C-PAP to the hospital with you in case you may have to spend the night.   Notify your doctor if there is any change in your medical condition (cold, fever, infection).  Wear comfortable clothing (specific to your surgery type) to the hospital.  After surgery, you can help prevent lung complications by doing breathing exercises.  Take deep breaths and cough every 1-2 hours. Your doctor may order a device called an Incentive Spirometer to help you take deep breaths.  If you are being admitted to the hospital overnight, leave your suitcase in the car. After surgery it may be brought to your room.  If you are taking public transportation, you will need to have a responsible adult (18 years or older) with you. Please confirm with your physician that it is acceptable to use public transportation.   Please call the [facility] at [phone] if you have any questions about these instructions.  Surgery Visitation Policy:  Patients undergoing a surgery or procedure may have one family member or support person with them as long as that person is not COVID-19 positive or experiencing its symptoms.  That person may remain in the waiting area  during the procedure.  Inpatient Visitation:    Visiting hours are 7 a.m. to 8 p.m. Inpatients will be allowed two visitors daily. The visitors may change each day during the patient's stay. No visitors under the age of 55. Any visitor under the age of 59 must be accompanied by an adult. The visitor must pass COVID-19 screenings, use hand sanitizer when entering and exiting the patient's room and  wear a mask at all times, including in the patient's room. Patients must also wear a mask when staff or their visitor are in the room. Masking is required regardless of vaccination status.

## 2020-12-10 ENCOUNTER — Other Ambulatory Visit: Payer: Self-pay | Admitting: Radiology

## 2020-12-10 ENCOUNTER — Inpatient Hospital Stay: Admission: RE | Admit: 2020-12-10 | Payer: Medicaid Other | Source: Ambulatory Visit

## 2020-12-10 NOTE — Progress Notes (Signed)
Preop interview attempted Friday, September 9 and again Monday, September 12. Patient's phone number on file is no longer in service and the one emergency contact number goes straight to voicemail, which a message was left both days to return the call. Dr. Cherrie Gauze office made aware of not being able to reach the patient. Patient will need labs and an EKG prior to upcoming surgery. The patient is scheduled for a covid test on Thursday, September 15. If she doesn't contact Pre-admit testing by then and shows up for the covid test, will do in-person interview, get a reliable contact number and obtain the required labs, EKG at that time.

## 2020-12-11 ENCOUNTER — Other Ambulatory Visit: Payer: Self-pay

## 2020-12-11 ENCOUNTER — Encounter
Admission: RE | Admit: 2020-12-11 | Discharge: 2020-12-11 | Disposition: A | Discharge status: Home / Self Care | Payer: Medicaid Other | Source: Ambulatory Visit | Attending: Urology | Admitting: Urology

## 2020-12-11 DIAGNOSIS — I10 Essential (primary) hypertension: Secondary | ICD-10-CM | POA: Diagnosis not present

## 2020-12-11 DIAGNOSIS — Z01818 Encounter for other preprocedural examination: Secondary | ICD-10-CM | POA: Insufficient documentation

## 2020-12-11 HISTORY — DX: Personal history of urinary calculi: Z87.442

## 2020-12-11 HISTORY — DX: Depression, unspecified: F32.A

## 2020-12-11 HISTORY — DX: Other complications of anesthesia, initial encounter: T88.59XA

## 2020-12-11 HISTORY — DX: Chronic obstructive pulmonary disease, unspecified: J44.9

## 2020-12-11 HISTORY — DX: Rheumatoid arthritis, unspecified: M06.9

## 2020-12-11 HISTORY — DX: Unspecified asthma, uncomplicated: J45.909

## 2020-12-11 HISTORY — DX: Anxiety disorder, unspecified: F41.9

## 2020-12-11 LAB — BASIC METABOLIC PANEL
Anion gap: 7 (ref 5–15)
BUN: 14 mg/dL (ref 8–23)
CO2: 30 mmol/L (ref 22–32)
Calcium: 9.1 mg/dL (ref 8.9–10.3)
Chloride: 103 mmol/L (ref 98–111)
Creatinine, Ser: 0.92 mg/dL (ref 0.44–1.00)
GFR, Estimated: >60 mL/min (ref >60)
Glucose, Bld: 87 mg/dL (ref 70–99)
Potassium: 3.4 mmol/L — ABNORMAL LOW (ref 3.5–5.1)
Sodium: 140 mmol/L (ref 135–145)

## 2020-12-11 LAB — CBC
HCT: 39 % (ref 36.0–46.0)
Hemoglobin: 14.4 g/dL (ref 12.0–15.0)
MCH: 33.8 pg (ref 26.0–34.0)
MCHC: 36.9 g/dL — ABNORMAL HIGH (ref 30.0–36.0)
MCV: 91.5 fL (ref 80.0–100.0)
Platelets: 175 10*3/uL (ref 150–400)
RBC: 4.26 MIL/uL (ref 3.87–5.11)
RDW: 15.5 % (ref 11.5–15.5)
WBC: 6 10*3/uL (ref 4.0–10.5)
nRBC: 0 % (ref 0.0–0.2)

## 2020-12-11 LAB — PROTIME-INR
INR: 0.9 (ref 0.8–1.2)
Prothrombin Time: 12.6 seconds (ref 11.4–15.2)

## 2020-12-11 NOTE — Pre-Procedure Instructions (Signed)
Arrived to preop appt, required labs,EKG obtained.

## 2020-12-11 NOTE — Patient Instructions (Signed)
Your procedure is scheduled on: Monday, September 19 Report to the Registration Desk on the 1st floor of the [facility]. To find out your arrival time, please call [phone] between 1PM - 3PM on: Friday, September 16  REMEMBER: Instructions that are not followed completely may result in serious medical risk, up to and including death; or upon the discretion of your surgeon and anesthesiologist your surgery may need to be rescheduled.  Do not eat or drink after midnight the night before surgery.  No gum chewing, lozengers or hard candies.  TAKE THESE MEDICATIONS THE MORNING OF SURGERY WITH A SIP OF WATER:  CLONAZEPAM IF NEEDED FOR ANXIETY  One week prior to surgery: Stop Anti-inflammatories (NSAIDS) such as Advil, Aleve, Ibuprofen, Motrin, Naproxen, Naprosyn and Aspirin based products such as Excedrin, Goodys Powder, BC Powder. Stop ANY OVER THE COUNTER supplements until after surgery. You may however, continue to take Tylenol if needed for pain up until the day of surgery.  No Alcohol for 24 hours before or after surgery.  No Smoking including e-cigarettes for 24 hours prior to surgery.  No chewable tobacco products for at least 6 hours prior to surgery.  No nicotine patches on the day of surgery.  Do not use any "recreational" drugs for at least a week prior to your surgery.  Please be advised that the combination of cocaine and anesthesia may have negative outcomes, up to and including death. If you test positive for cocaine, your surgery will be cancelled.  On the morning of surgery brush your teeth with toothpaste and water, you may rinse your mouth with mouthwash if you wish. Do not swallow any toothpaste or mouthwash.  Do not wear jewelry, make-up, hairpins, clips or nail polish.  Do not wear lotions, powders, or perfumes.   Do not shave body from the neck down 48 hours prior to surgery just in case you cut yourself which could leave a site for infection.  Also,  freshly shaved skin may become irritated if using the CHG soap.  Contact lenses, hearing aids and dentures may not be worn into surgery.  Do not bring valuables to the hospital. [facility] is not responsible for any missing/lost belongings or valuables.   Use CHG Soap as directed on instruction sheet.  Notify your doctor if there is any change in your medical condition (cold, fever, infection).  Wear comfortable clothing (specific to your surgery type) to the hospital.  After surgery, you can help prevent lung complications by doing breathing exercises.  Take deep breaths and cough every 1-2 hours. Your doctor may order a device called an Incentive Spirometer to help you take deep breaths.  If you are being admitted to the hospital overnight, leave your suitcase in the car. After surgery it may be brought to your room.  Please call the [facility] at [phone] if you have any questions about these instructions.  Surgery Visitation Policy:  Patients undergoing a surgery or procedure may have one family member or support person with them as long as that person is not COVID-19 positive or experiencing its symptoms.  That person may remain in the waiting area during the procedure.  Inpatient Visitation:    Visiting hours are 7 a.m. to 8 p.m. Inpatients will be allowed two visitors daily. The visitors may change each day during the patient's stay. No visitors under the age of 74. Any visitor under the age of 92 must be accompanied by an adult. The visitor must pass  COVID-19 screenings, use hand sanitizer when entering and exiting the patient's room and wear a mask at all times, including in the patient's room. Patients must also wear a mask when staff or their visitor are in the room. Masking is required regardless of vaccination status.

## 2020-12-13 ENCOUNTER — Telehealth: Payer: Self-pay | Admitting: *Deleted

## 2020-12-13 ENCOUNTER — Other Ambulatory Visit: Payer: Medicaid Other

## 2020-12-13 ENCOUNTER — Inpatient Hospital Stay: Admission: RE | Admit: 2020-12-13 | Payer: Medicaid Other | Source: Ambulatory Visit

## 2020-12-13 ENCOUNTER — Encounter: Payer: Self-pay | Admitting: Urgent Care

## 2020-12-14 ENCOUNTER — Other Ambulatory Visit: Payer: Medicaid Other

## 2020-12-14 ENCOUNTER — Other Ambulatory Visit
Admission: RE | Admit: 2020-12-14 | Discharge: 2020-12-14 | Disposition: A | Discharge status: Home / Self Care | Payer: Medicaid Other | Source: Ambulatory Visit | Attending: Urology | Admitting: Urology

## 2020-12-14 ENCOUNTER — Other Ambulatory Visit: Payer: Self-pay

## 2020-12-14 ENCOUNTER — Encounter: Payer: Self-pay | Admitting: Urology

## 2020-12-14 DIAGNOSIS — Z01812 Encounter for preprocedural laboratory examination: Secondary | ICD-10-CM | POA: Insufficient documentation

## 2020-12-14 DIAGNOSIS — Z20822 Contact with and (suspected) exposure to covid-19: Secondary | ICD-10-CM | POA: Diagnosis not present

## 2020-12-14 DIAGNOSIS — N200 Calculus of kidney: Secondary | ICD-10-CM

## 2020-12-14 LAB — MICROSCOPIC EXAMINATION: Epithelial Cells (non renal): >10 /hpf — AB (ref 0–10)

## 2020-12-14 LAB — SARS CORONAVIRUS 2 (TAT 6-24 HRS): SARS Coronavirus 2: NEGATIVE

## 2020-12-14 LAB — URINALYSIS, COMPLETE
Bilirubin, UA: NEGATIVE
Glucose, UA: NEGATIVE
Nitrite, UA: NEGATIVE
Specific Gravity, UA: 1.02 (ref 1.005–1.030)
Urobilinogen, Ur: 1 mg/dL (ref 0.2–1.0)
pH, UA: 5.5 (ref 5.0–7.5)

## 2020-12-14 LAB — TYPE AND SCREEN
ABO/RH(D): A POS
Antibody Screen: NEGATIVE

## 2020-12-17 ENCOUNTER — Observation Stay
Admission: RE | Admit: 2020-12-17 | Discharge: 2020-12-18 | Disposition: A | Discharge status: Home / Self Care | Payer: Medicaid Other | Attending: Urology | Admitting: Urology

## 2020-12-17 ENCOUNTER — Ambulatory Visit: Payer: Medicaid Other | Admitting: Urgent Care

## 2020-12-17 ENCOUNTER — Ambulatory Visit: Payer: Medicaid Other

## 2020-12-17 ENCOUNTER — Ambulatory Visit
Admission: RE | Admit: 2020-12-17 | Discharge: 2020-12-17 | Disposition: A | Discharge status: Home / Self Care | Payer: Medicaid Other | Source: Ambulatory Visit | Attending: Urology | Admitting: Urology

## 2020-12-17 ENCOUNTER — Other Ambulatory Visit: Payer: Self-pay

## 2020-12-17 ENCOUNTER — Ambulatory Visit: Payer: Medicaid Other | Admitting: Anesthesiology

## 2020-12-17 ENCOUNTER — Encounter
Admission: RE | Disposition: A | Discharge status: Home / Self Care | Payer: Self-pay | Source: Home / Self Care | Attending: Urology

## 2020-12-17 ENCOUNTER — Encounter: Payer: Self-pay | Admitting: Urology

## 2020-12-17 DIAGNOSIS — Z8541 Personal history of malignant neoplasm of cervix uteri: Secondary | ICD-10-CM | POA: Diagnosis not present

## 2020-12-17 DIAGNOSIS — N200 Calculus of kidney: Secondary | ICD-10-CM

## 2020-12-17 DIAGNOSIS — J449 Chronic obstructive pulmonary disease, unspecified: Secondary | ICD-10-CM | POA: Insufficient documentation

## 2020-12-17 DIAGNOSIS — I10 Essential (primary) hypertension: Secondary | ICD-10-CM | POA: Diagnosis not present

## 2020-12-17 DIAGNOSIS — J45909 Unspecified asthma, uncomplicated: Secondary | ICD-10-CM | POA: Insufficient documentation

## 2020-12-17 DIAGNOSIS — N261 Atrophy of kidney (terminal): Secondary | ICD-10-CM | POA: Diagnosis not present

## 2020-12-17 DIAGNOSIS — Z79899 Other long term (current) drug therapy: Secondary | ICD-10-CM | POA: Diagnosis not present

## 2020-12-17 DIAGNOSIS — Z91040 Latex allergy status: Secondary | ICD-10-CM | POA: Insufficient documentation

## 2020-12-17 DIAGNOSIS — F17210 Nicotine dependence, cigarettes, uncomplicated: Secondary | ICD-10-CM | POA: Diagnosis not present

## 2020-12-17 HISTORY — PX: NEPHROLITHOTOMY: SHX5134

## 2020-12-17 HISTORY — DX: Dysphagia, unspecified: R13.10

## 2020-12-17 HISTORY — PX: IR URETERAL STENT RIGHT NEW ACCESS W/O SEP NEPHROSTOMY CATH: IMG6076

## 2020-12-17 LAB — ABO/RH: ABO/RH(D): A POS

## 2020-12-17 LAB — PROTIME-INR
INR: 0.8 (ref 0.8–1.2)
Prothrombin Time: 11.4 seconds (ref 11.4–15.2)

## 2020-12-17 LAB — CBC
HCT: 55.2 % — ABNORMAL HIGH (ref 36.0–46.0)
Hemoglobin: 18.4 g/dL — ABNORMAL HIGH (ref 12.0–15.0)
MCH: 29.9 pg (ref 26.0–34.0)
MCHC: 33.3 g/dL (ref 30.0–36.0)
MCV: 89.6 fL (ref 80.0–100.0)
Platelets: 184 10*3/uL (ref 150–400)
RBC: 6.16 MIL/uL — ABNORMAL HIGH (ref 3.87–5.11)
RDW: 15.2 % (ref 11.5–15.5)
WBC: 4.2 10*3/uL (ref 4.0–10.5)
nRBC: 0 % (ref 0.0–0.2)

## 2020-12-17 LAB — BASIC METABOLIC PANEL
Anion gap: 7 (ref 5–15)
BUN: 18 mg/dL (ref 8–23)
CO2: 27 mmol/L (ref 22–32)
Calcium: 9.1 mg/dL (ref 8.9–10.3)
Chloride: 105 mmol/L (ref 98–111)
Creatinine, Ser: 0.86 mg/dL (ref 0.44–1.00)
GFR, Estimated: >60 mL/min (ref >60)
Glucose, Bld: 96 mg/dL (ref 70–99)
Potassium: 4.2 mmol/L (ref 3.5–5.1)
Sodium: 139 mmol/L (ref 135–145)

## 2020-12-17 SURGERY — NEPHROLITHOTOMY PERCUTANEOUS
Anesthesia: General | Laterality: Right

## 2020-12-17 MED ORDER — FENTANYL CITRATE (PF) 100 MCG/2ML IJ SOLN
25.0000 ug | INTRAMUSCULAR | Status: DC | PRN
  Administered 2020-12-17 (×2): 50 ug via INTRAVENOUS

## 2020-12-17 MED ORDER — OXYCODONE HCL 5 MG PO TABS: 5.0000 mg | ORAL_TABLET | Freq: Once | ORAL | Status: AC | PRN

## 2020-12-17 MED ORDER — SODIUM CHLORIDE 0.9 % IV SOLN: INTRAVENOUS | Status: DC

## 2020-12-17 MED ORDER — ACETAMINOPHEN 10 MG/ML IV SOLN
INTRAVENOUS | Status: DC | PRN
Start: 2020-12-17 — End: 2020-12-17
  Administered 2020-12-17: 1000 mg via INTRAVENOUS

## 2020-12-17 MED ORDER — EPHEDRINE SULFATE 50 MG/ML IJ SOLN
INTRAMUSCULAR | Status: DC | PRN
  Administered 2020-12-17: 5 mg via INTRAVENOUS
  Administered 2020-12-17: 10 mg via INTRAVENOUS

## 2020-12-17 MED ORDER — LACTATED RINGERS IV SOLN: INTRAVENOUS | Status: DC

## 2020-12-17 MED ORDER — KETAMINE HCL 10 MG/ML IJ SOLN
INTRAMUSCULAR | Status: DC | PRN
  Administered 2020-12-17: 50 mg via INTRAVENOUS

## 2020-12-17 MED ORDER — LIDOCAINE HCL 1 % IJ SOLN
INTRAMUSCULAR | Status: AC
  Administered 2020-12-17: 18 mL
  Filled 2020-12-17: qty 20

## 2020-12-17 MED ORDER — DIPHENHYDRAMINE HCL 50 MG/ML IJ SOLN: 12.5000 mg | Freq: Four times a day (QID) | INTRAMUSCULAR | Status: DC | PRN

## 2020-12-17 MED ORDER — ACETAMINOPHEN 325 MG PO TABS: 650.0000 mg | ORAL_TABLET | ORAL | Status: DC | PRN

## 2020-12-17 MED ORDER — SUGAMMADEX SODIUM 200 MG/2ML IV SOLN
INTRAVENOUS | Status: DC | PRN
  Administered 2020-12-17: 200 mg via INTRAVENOUS

## 2020-12-17 MED ORDER — OXYCODONE HCL 5 MG PO TABS
ORAL_TABLET | ORAL | Status: AC
  Administered 2020-12-17: 5 mg via ORAL
  Filled 2020-12-17: qty 1

## 2020-12-17 MED ORDER — OXYCODONE HCL 5 MG/5ML PO SOLN: 5.0000 mg | Freq: Once | ORAL | Status: AC | PRN

## 2020-12-17 MED ORDER — FENTANYL CITRATE (PF) 100 MCG/2ML IJ SOLN
INTRAMUSCULAR | Status: DC | PRN
  Administered 2020-12-17: 25 ug via INTRAVENOUS

## 2020-12-17 MED ORDER — LIDOCAINE HCL (CARDIAC) PF 100 MG/5ML IV SOSY
PREFILLED_SYRINGE | INTRAVENOUS | Status: DC | PRN
Start: 2020-12-17 — End: 2020-12-17
  Administered 2020-12-17: 50 mg via INTRAVENOUS

## 2020-12-17 MED ORDER — ACETAMINOPHEN 10 MG/ML IV SOLN: 1000.0000 mg | Freq: Once | INTRAVENOUS | Status: DC | PRN

## 2020-12-17 MED ORDER — ONDANSETRON HCL 4 MG/2ML IJ SOLN: 4.0000 mg | INTRAMUSCULAR | Status: DC | PRN

## 2020-12-17 MED ORDER — ROCURONIUM BROMIDE 100 MG/10ML IV SOLN
INTRAVENOUS | Status: DC | PRN
  Administered 2020-12-17: 40 mg via INTRAVENOUS

## 2020-12-17 MED ORDER — DEXMEDETOMIDINE HCL IN NACL 200 MCG/50ML IV SOLN
INTRAVENOUS | Status: DC | PRN
  Administered 2020-12-17: 4 ug via INTRAVENOUS
  Administered 2020-12-17 (×2): 8 ug via INTRAVENOUS

## 2020-12-17 MED ORDER — CEFAZOLIN SODIUM-DEXTROSE 1-4 GM/50ML-% IV SOLN
1.0000 g | Freq: Three times a day (TID) | INTRAVENOUS | Status: AC
  Administered 2020-12-17 (×2): 1 g via INTRAVENOUS
  Filled 2020-12-17 (×2): qty 50

## 2020-12-17 MED ORDER — MIDAZOLAM HCL 2 MG/2ML IJ SOLN
INTRAMUSCULAR | Status: DC | PRN
  Administered 2020-12-17: 1 mg via INTRAVENOUS

## 2020-12-17 MED ORDER — MORPHINE SULFATE (PF) 2 MG/ML IV SOLN
2.0000 mg | INTRAVENOUS | Status: DC | PRN
  Administered 2020-12-17: 2 mg via INTRAVENOUS
  Filled 2020-12-17: qty 1

## 2020-12-17 MED ORDER — SODIUM CHLORIDE 0.9 % IV SOLN
2.0000 g | Freq: Once | INTRAVENOUS | Status: DC
  Filled 2020-12-17: qty 20

## 2020-12-17 MED ORDER — 0.9 % SODIUM CHLORIDE (POUR BTL) OPTIME
TOPICAL | Status: DC | PRN
  Administered 2020-12-17: 10 mL

## 2020-12-17 MED ORDER — KETAMINE HCL 50 MG/5ML IJ SOSY
PREFILLED_SYRINGE | INTRAMUSCULAR | Status: AC
  Filled 2020-12-17: qty 5

## 2020-12-17 MED ORDER — MIDAZOLAM HCL 2 MG/2ML IJ SOLN
INTRAMUSCULAR | Status: DC | PRN
  Administered 2020-12-17: 2 mg via INTRAVENOUS

## 2020-12-17 MED ORDER — ENALAPRIL MALEATE 20 MG PO TABS
20.0000 mg | ORAL_TABLET | Freq: Every day | ORAL | Status: DC | PRN
  Filled 2020-12-17: qty 1

## 2020-12-17 MED ORDER — DEXAMETHASONE SODIUM PHOSPHATE 10 MG/ML IJ SOLN
INTRAMUSCULAR | Status: DC | PRN
  Administered 2020-12-17: 10 mg via INTRAVENOUS

## 2020-12-17 MED ORDER — CYCLOBENZAPRINE HCL 10 MG PO TABS
10.0000 mg | ORAL_TABLET | Freq: Every day | ORAL | Status: DC
Start: 2020-12-17 — End: 2020-12-18
  Administered 2020-12-17: 10 mg via ORAL
  Filled 2020-12-17: qty 1

## 2020-12-17 MED ORDER — ORAL CARE MOUTH RINSE: 15.0000 mL | Freq: Once | OROMUCOSAL | Status: AC

## 2020-12-17 MED ORDER — BELLADONNA ALKALOIDS-OPIUM 16.2-60 MG RE SUPP
1.0000 | Freq: Four times a day (QID) | RECTAL | Status: DC | PRN
  Administered 2020-12-17: 1 via RECTAL
  Filled 2020-12-17: qty 1

## 2020-12-17 MED ORDER — FENTANYL CITRATE (PF) 100 MCG/2ML IJ SOLN
INTRAMUSCULAR | Status: AC
  Filled 2020-12-17: qty 2

## 2020-12-17 MED ORDER — DOCUSATE SODIUM 100 MG PO CAPS
100.0000 mg | ORAL_CAPSULE | Freq: Two times a day (BID) | ORAL | Status: DC
  Administered 2020-12-17 (×2): 100 mg via ORAL
  Filled 2020-12-17 (×2): qty 1

## 2020-12-17 MED ORDER — OXYCODONE-ACETAMINOPHEN 5-325 MG PO TABS
1.0000 | ORAL_TABLET | ORAL | Status: DC | PRN
  Administered 2020-12-17 – 2020-12-18 (×4): 2 via ORAL
  Filled 2020-12-17 (×4): qty 2

## 2020-12-17 MED ORDER — CEFAZOLIN SODIUM-DEXTROSE 2-4 GM/100ML-% IV SOLN
INTRAVENOUS | Status: AC
  Filled 2020-12-17: qty 100

## 2020-12-17 MED ORDER — IOHEXOL 300 MG/ML  SOLN
30.0000 mL | Freq: Once | INTRAMUSCULAR | Status: AC | PRN
  Administered 2020-12-17: 30 mL
  Filled 2020-12-17: qty 30

## 2020-12-17 MED ORDER — FENTANYL CITRATE (PF) 100 MCG/2ML IJ SOLN
INTRAMUSCULAR | Status: AC
  Administered 2020-12-17: 50 ug via INTRAVENOUS
  Filled 2020-12-17: qty 2

## 2020-12-17 MED ORDER — PROPOFOL 10 MG/ML IV BOLUS
INTRAVENOUS | Status: DC | PRN
  Administered 2020-12-17: 150 mg via INTRAVENOUS

## 2020-12-17 MED ORDER — IPRATROPIUM-ALBUTEROL 0.5-2.5 (3) MG/3ML IN SOLN: 3.0000 mL | Freq: Four times a day (QID) | RESPIRATORY_TRACT | Status: DC | PRN

## 2020-12-17 MED ORDER — DIPHENHYDRAMINE HCL 12.5 MG/5ML PO ELIX
12.5000 mg | ORAL_SOLUTION | Freq: Four times a day (QID) | ORAL | Status: DC | PRN
  Filled 2020-12-17: qty 5

## 2020-12-17 MED ORDER — ACETAMINOPHEN 10 MG/ML IV SOLN
INTRAVENOUS | Status: AC
  Filled 2020-12-17: qty 100

## 2020-12-17 MED ORDER — GABAPENTIN 600 MG PO TABS
600.0000 mg | ORAL_TABLET | Freq: Three times a day (TID) | ORAL | Status: DC | PRN
  Administered 2020-12-17: 600 mg via ORAL
  Filled 2020-12-17: qty 1

## 2020-12-17 MED ORDER — CHLORHEXIDINE GLUCONATE 0.12 % MT SOLN
OROMUCOSAL | Status: AC
  Filled 2020-12-17: qty 15

## 2020-12-17 MED ORDER — FAMOTIDINE 20 MG PO TABS
20.0000 mg | ORAL_TABLET | Freq: Once | ORAL | Status: AC
  Administered 2020-12-17: 20 mg via ORAL

## 2020-12-17 MED ORDER — CEFAZOLIN SODIUM-DEXTROSE 2-4 GM/100ML-% IV SOLN
2.0000 g | INTRAVENOUS | Status: AC
  Administered 2020-12-17: 2 g via INTRAVENOUS

## 2020-12-17 MED ORDER — FENTANYL CITRATE (PF) 100 MCG/2ML IJ SOLN
INTRAMUSCULAR | Status: DC | PRN
  Administered 2020-12-17 (×4): 25 ug via INTRAVENOUS

## 2020-12-17 MED ORDER — ONDANSETRON HCL 4 MG/2ML IJ SOLN
INTRAMUSCULAR | Status: AC
  Filled 2020-12-17: qty 2

## 2020-12-17 MED ORDER — ALBUTEROL SULFATE (2.5 MG/3ML) 0.083% IN NEBU: 2.5000 mg | INHALATION_SOLUTION | Freq: Four times a day (QID) | RESPIRATORY_TRACT | Status: DC | PRN

## 2020-12-17 MED ORDER — FAMOTIDINE 20 MG PO TABS
ORAL_TABLET | ORAL | Status: AC
  Filled 2020-12-17: qty 1

## 2020-12-17 MED ORDER — OXYBUTYNIN CHLORIDE 5 MG PO TABS
5.0000 mg | ORAL_TABLET | Freq: Three times a day (TID) | ORAL | Status: DC | PRN
  Administered 2020-12-17: 5 mg via ORAL
  Filled 2020-12-17: qty 1

## 2020-12-17 MED ORDER — SODIUM CHLORIDE 0.9 % IV SOLN
INTRAVENOUS | Status: DC | PRN
  Administered 2020-12-17: 2 g via INTRAVENOUS

## 2020-12-17 MED ORDER — ONDANSETRON HCL 4 MG/2ML IJ SOLN
INTRAMUSCULAR | Status: DC | PRN
  Administered 2020-12-17: 4 mg via INTRAVENOUS

## 2020-12-17 MED ORDER — PROPOFOL 10 MG/ML IV BOLUS
INTRAVENOUS | Status: AC
  Filled 2020-12-17: qty 20

## 2020-12-17 MED ORDER — MIDAZOLAM HCL 2 MG/2ML IJ SOLN
INTRAMUSCULAR | Status: AC
  Filled 2020-12-17: qty 2

## 2020-12-17 MED ORDER — HEPARIN SODIUM (PORCINE) 5000 UNIT/ML IJ SOLN
5000.0000 [IU] | Freq: Three times a day (TID) | INTRAMUSCULAR | Status: DC
  Administered 2020-12-17 – 2020-12-18 (×3): 5000 [IU] via SUBCUTANEOUS
  Filled 2020-12-17 (×3): qty 1

## 2020-12-17 MED ORDER — MIDAZOLAM HCL 2 MG/2ML IJ SOLN
INTRAMUSCULAR | Status: AC
  Filled 2020-12-17: qty 4

## 2020-12-17 MED ORDER — ONDANSETRON HCL 4 MG/2ML IJ SOLN
4.0000 mg | Freq: Once | INTRAMUSCULAR | Status: AC | PRN
  Administered 2020-12-17: 4 mg via INTRAVENOUS

## 2020-12-17 MED ORDER — CHLORHEXIDINE GLUCONATE CLOTH 2 % EX PADS
6.0000 | MEDICATED_PAD | Freq: Every day | CUTANEOUS | Status: DC
  Administered 2020-12-18: 6 via TOPICAL

## 2020-12-17 MED ORDER — DEXMEDETOMIDINE HCL IN NACL 200 MCG/50ML IV SOLN
INTRAVENOUS | Status: AC
  Filled 2020-12-17: qty 50

## 2020-12-17 MED ORDER — CHLORHEXIDINE GLUCONATE 0.12 % MT SOLN
15.0000 mL | Freq: Once | OROMUCOSAL | Status: AC
  Administered 2020-12-17: 15 mL via OROMUCOSAL

## 2020-12-17 MED ORDER — IOHEXOL 180 MG/ML  SOLN
INTRAMUSCULAR | Status: DC | PRN
  Administered 2020-12-17: 45 mL

## 2020-12-17 MED ORDER — SUCCINYLCHOLINE CHLORIDE 200 MG/10ML IV SOSY
PREFILLED_SYRINGE | INTRAVENOUS | Status: DC | PRN
  Administered 2020-12-17: 100 mg via INTRAVENOUS

## 2020-12-17 MED ORDER — MECLIZINE HCL 25 MG PO TABS
25.0000 mg | ORAL_TABLET | Freq: Three times a day (TID) | ORAL | Status: DC | PRN
  Filled 2020-12-17: qty 1

## 2020-12-17 SURGICAL SUPPLY — 64 items
ADAPTER IRRIG TUBE 2 SPIKE SOL (ADAPTER) ×2 IMPLANT
ADAPTER SCOPE UROLOK II (MISCELLANEOUS) IMPLANT
BAG URINE DRAIN 2000ML AR STRL (UROLOGICAL SUPPLIES) ×2 IMPLANT
BALLN NEPHROSTOMY 10X15 (UROLOGICAL SUPPLIES) ×2 IMPLANT
BASKET ZERO TIP 1.9FR (BASKET) ×2 IMPLANT
BLADE SURG 15 STRL LF DISP TIS (BLADE) ×1 IMPLANT
BLADE SURG 15 STRL SS (BLADE) ×1
CATH COUNCIL 22FR (CATHETERS) IMPLANT
CATH FOLEY 2W COUNCIL 20FR 5CC (CATHETERS) ×2 IMPLANT
CATH FOLEY 2W COUNCIL 5CC 18FR (CATHETERS) IMPLANT
CATH STENT KAYE NEPHR TAMP (CATHETERS) IMPLANT
CATH URET FLEX-TIP 2 LUMEN 10F (CATHETERS) IMPLANT
CATH URETL OPEN 5X70 (CATHETERS) IMPLANT
CATH/STENT KAYE NEPHR TAMP (CATHETERS)
CHLORAPREP W/TINT 26 (MISCELLANEOUS) ×2 IMPLANT
CNTNR SPEC 2.5X3XGRAD LEK (MISCELLANEOUS)
CONT SPEC 4OZ STER OR WHT (MISCELLANEOUS)
CONTAINER SPEC 2.5X3XGRAD LEK (MISCELLANEOUS) IMPLANT
DRAPE 3/4 80X56 (DRAPES) ×2 IMPLANT
DRAPE C ARM PK CFD 31 SPINE (DRAPES) ×2 IMPLANT
DRAPE C-ARM XRAY 36X54 (DRAPES) ×2 IMPLANT
DRAPE SURG 17X11 SM STRL (DRAPES) ×8 IMPLANT
GAUZE 4X4 16PLY ~~LOC~~+RFID DBL (SPONGE) ×2 IMPLANT
GAUZE SPONGE 4X4 12PLY STRL (GAUZE/BANDAGES/DRESSINGS) ×2 IMPLANT
GLIDEWIRE STIFF .35X180X3 HYDR (WIRE) IMPLANT
GLOVE SURG ENC MOIS LTX SZ6.5 (GLOVE) IMPLANT
GLOVE SURG NEOP MICRO LF SZ6.5 (GLOVE) ×6 IMPLANT
GLOVE SURG UNDER POLY LF SZ6.5 (GLOVE) ×4 IMPLANT
GOWN STRL REUS W/ TWL LRG LVL3 (GOWN DISPOSABLE) ×3 IMPLANT
GOWN STRL REUS W/TWL LRG LVL3 (GOWN DISPOSABLE) ×3
GUIDEWIRE GREEN .038 145CM (MISCELLANEOUS) IMPLANT
GUIDEWIRE INTRO SET STRAIGHT (WIRE) ×2 IMPLANT
GUIDEWIRE STR DUAL SENSOR (WIRE) IMPLANT
GUIDEWIRE STR ZIPWIRE 035X150 (MISCELLANEOUS) IMPLANT
GUIDEWIRE SUPER STIFF (WIRE) ×2 IMPLANT
HOLDER FOLEY CATH W/STRAP (MISCELLANEOUS) ×2 IMPLANT
IV NS IRRIG 3000ML ARTHROMATIC (IV SOLUTION) ×12 IMPLANT
KIT PROBE TRILOGY 3.9X350 (MISCELLANEOUS) IMPLANT
MANIFOLD NEPTUNE II (INSTRUMENTS) ×4 IMPLANT
MAT ABSORB  FLUID 56X50 GRAY (MISCELLANEOUS) ×2
MAT ABSORB FLUID 56X50 GRAY (MISCELLANEOUS) ×2 IMPLANT
NDL FASCIA INCISION 18GA (NEEDLE) ×2 IMPLANT
PACK BASIN MINOR ARMC (MISCELLANEOUS) ×2 IMPLANT
PAD ABD DERMACEA PRESS 5X9 (GAUZE/BANDAGES/DRESSINGS) ×4 IMPLANT
PAD PREP 24X41 OB/GYN DISP (PERSONAL CARE ITEMS) ×2 IMPLANT
SET IRRIGATING DISP (SET/KITS/TRAYS/PACK) ×2 IMPLANT
SHEET NEURO XL SOL CTL (MISCELLANEOUS) ×2 IMPLANT
SPONGE DRAIN TRACH 4X4 STRL 2S (GAUZE/BANDAGES/DRESSINGS) ×2 IMPLANT
STENT URET 6FRX24 CONTOUR (STENTS) IMPLANT
STENT URET 6FRX26 CONTOUR (STENTS) IMPLANT
STRAP SAFETY 5IN WIDE (MISCELLANEOUS) ×2 IMPLANT
SURGILUBE 2OZ TUBE FLIPTOP (MISCELLANEOUS) ×2 IMPLANT
SUT SILK 0 SH 30 (SUTURE) ×2 IMPLANT
SYR 10ML LL (SYRINGE) ×2 IMPLANT
SYR 20ML LL LF (SYRINGE) ×2 IMPLANT
SYR 30ML LL (SYRINGE) ×2 IMPLANT
SYR TOOMEY IRRIG 70ML (MISCELLANEOUS) ×2
SYRINGE TOOMEY IRRIG 70ML (MISCELLANEOUS) ×1 IMPLANT
TAPE CLOTH 3X10 WHT NS LF (GAUZE/BANDAGES/DRESSINGS) ×2 IMPLANT
TAPE MICROFOAM 4IN (TAPE) ×2 IMPLANT
TRAY FOLEY MTR SLVR 16FR STAT (SET/KITS/TRAYS/PACK) ×2 IMPLANT
TUBING CONNECTING 10 (TUBING) ×2 IMPLANT
WATER STERILE IRR 1000ML POUR (IV SOLUTION) ×2 IMPLANT
WATER STERILE IRR 500ML POUR (IV SOLUTION) ×2 IMPLANT

## 2020-12-17 NOTE — H&P (Signed)
12/17/20 RRR CTAB  No changes   Melanie Hubbard 10/20/1955 [id number]   Referring provider: No referring provider defined for this encounter.      Chief Complaint  Patient presents with   Nephrolithiasis      HPI: 65 year old female who presents today for further evaluation of chronic right flank/ low back pain pain found to have a nonobstructing 2.3 cm right renal pelvic stone.  She was seen and evaluated in the emergency room on 10/14/2020 with chronic flank pain.  This was worsening.  She underwent noncontrast CT scan indicating a large 2.3 cm right renal pelvic stone with out hydronephrosis.  We have no previous imaging for comparison.  There is some chronic atrophy in the right kidney.  I personally reviewed these images today.   She does have multiple medical comorbidities including a personal history of lupus.  Was is currently not being treated.  She does not have a rheumatologist.  She reports that she recently moved to [place] and is reestablishing here.  She is seen [facility] as her primary care physician but is yet to establish with a rheumatologist because she has declined medications for her lupus in the past due to concern for vision loss.   Most recent creatinine 1.13 she does report that she has had recurrence like acute kidney injury on multiple occasions.  She does have a personal history of kidney stones.  She reports that she saw a urologist in [place] a few years ago.  Into be treated for stone but when they went to remove it, it had "disappeared".   She does have some urinary frequency otherwise no dysuria or gross hematuria.  No fevers or chills.   She is requesting narcotics today.   PMH:     Past Medical History:  Diagnosis Date   Hypertension     Lupus ([place])     Vertigo        Surgical History: History reviewed. No pertinent surgical history.   Home Medications:  Allergies as of 10/17/2020         Reactions    Latex Itching             Medication List           Accurate as of October 17, 2020 12:52 PM. If you have any questions, ask your nurse or doctor.              cyclobenzaprine 10 MG tablet Commonly known as: FLEXERIL Take 10 mg by mouth 3 (three) times daily as needed for muscle spasms.    enalapril 20 MG tablet Commonly known as: VASOTEC Take 20 mg by mouth daily.    gabapentin 600 MG tablet Commonly known as: NEURONTIN Take 600 mg by mouth 3 (three) times daily.    meclizine 25 MG tablet Commonly known as: ANTIVERT Take 1 tablet (25 mg total) by mouth 3 (three) times daily as needed for dizziness or nausea.    oxyCODONE 5 MG immediate release tablet Commonly known as: Oxy IR/ROXICODONE Take 20 mg by mouth every 4 (four) hours as needed for severe pain.    predniSONE 20 MG tablet Commonly known as: DELTASONE Take 2 tablets (40 mg total) by mouth daily with breakfast for 5 days.             Allergies:      Allergies  Allergen Reactions   Latex Itching      Family History: History reviewed. No pertinent family history.  Social History:  reports that she has been smoking cigarettes. She has been smoking an average of .5 packs per day. She has never used smokeless tobacco. No history on file for alcohol use and drug use.     Physical Exam: BP (!) 156/89   Pulse 78   Ht 5' (1.524 m)   Wt 211 lb (95.7 kg)   BMI 41.21 kg/m   Constitutional:  Alert and oriented, No acute distress. HEENT: [place] AT, moist mucus membranes.  Trachea midline, no masses. Cardiovascular: No clubbing, cyanosis, or edema. Respiratory: Normal respiratory effort, no increased work of breathing. Skin: No rashes, bruises or suspicious lesions. Neurologic: Grossly intact, no focal deficits, moving all 4 extremities. Psychiatric: Normal mood and affect.   Laboratory Data: Recent Labs       Lab Results  Component Value Date    WBC 7.6 10/14/2020    HGB 14.3 10/14/2020    HCT 42.7 10/14/2020    MCV  90.7 10/14/2020    PLT 194 10/14/2020        Recent Labs       Lab Results  Component Value Date    CREATININE 0.91 10/14/2020        Urinalysis Urinalysis today consistent with chronic stone, 6-10 WBCs, 3-10 RBCs, few bacteria, nitrate negative.   Pertinent Imaging:   CT Renal Stone Study   Narrative CLINICAL DATA:  Chronic lower back pain, nephrolithiasis   EXAM: CT ABDOMEN AND PELVIS WITHOUT CONTRAST   TECHNIQUE: Multidetector CT imaging of the abdomen and pelvis was performed following the standard protocol without IV contrast.   COMPARISON:  None.   FINDINGS: Lower chest: No acute pleural or parenchymal lung disease. Dense calcification of the mitral annulus.   Hepatobiliary: Unenhanced imaging of the liver demonstrates mild heterogeneous attenuation which may reflect underlying steatosis. Gallbladder surgically absent. No biliary duct dilation.   Pancreas: Unremarkable. No pancreatic ductal dilatation or surrounding inflammatory changes.   Spleen: Normal in size without focal abnormality.   Adrenals/Urinary Tract: There is a 2.3 x 2.2 x 2.2 cm calculus within the right renal pelvis. No evidence of right-sided hydronephrosis.   The left kidney is unremarkable. Bladder is decompressed, which limits evaluation. The adrenals are unremarkable.   Stomach/Bowel: No bowel obstruction or ileus. Normal appendix right lower quadrant. Diffuse diverticulosis of the descending and sigmoid colon. Mild wall thickening of the sigmoid colon may reflect scarring from previous bouts of diverticulitis. No inflammatory changes to suggest acute diverticulitis.   Vascular/Lymphatic: Aortic atherosclerosis. No enlarged abdominal or pelvic lymph nodes.   Reproductive: Status post hysterectomy. No adnexal masses.   Other: No free fluid or free gas.  No abdominal wall hernia.   Musculoskeletal: No acute or destructive bony lesions. Postsurgical changes are seen at L4-5.  Reconstructed images demonstrate no additional findings.   IMPRESSION: 1. Nonobstructing 2.3 cm calculus in the right renal pelvis. 2. Heterogeneous liver attenuation likely representing underlying hepatic steatosis. 3. Diverticulosis of the descending and sigmoid colon. There are no acute inflammatory changes at this time, but wall thickening of the sigmoid colon suggests scarring from previous bouts of diverticulitis. 4.  Aortic Atherosclerosis (ICD10-I70.0).     Electronically Signed By: Randa Ngo M.D. On: 10/14/2020 17:26   Above CT scan was personally reviewed.  Hounsfield units approximately 1250.  There is fairly significant right renal atrophy.   Assessment & Plan:     1. Right Kidney stone Based on size, location, as well as right renal atrophy, this  is likely a very chronic stone   Based on the size and location of the stone, I have strongly recommended right PCNL.  We discussed that she does have some fairly significant atrophy compared to the left but with her history of lupus, we would like the most conservative approach and try to spare as many nephrons as possible.     We discussed PCNL at length.  She is already done some reading about this in anticipation of her visit today.  She understands the preoperative, intraoperative and postoperative course.  She understands the need for nephrostomy tube, possible ureteral stent and Foley postop with at least observation admission.  We discussed the risk of bleeding as well as the 5% risk of need for blood transfusion.  We discussed the risk of infection, damage surrounding structures, need for staged or multiple procedures amongst others.  All questions were answered.  Preoperative UA/urine culture - Urinalysis, Complete - CULTURE, URINE COMPREHENSIVE   2. Right renal atrophy Likely secondary to #1 see discussion   3. Right flank pain Patient is requesting narcotics today, however given that the stone is  nonobstructing and very chronic, I am hesitant to prescribe these today.  She understands and will utilize medical marijuana that she has from out of state physician to help with pain in the interim.  She understands that medical marijuana is currently not legal in [place].  Schedule right PCNL     Hollice Espy, MD   [facility] [address] [place],  [address] [phone]

## 2020-12-17 NOTE — Anesthesia Procedure Notes (Signed)
Procedure Name: Intubation Date/Time: 12/17/2020 10:22 AM Performed by: Biagio Borg, CRNA Pre-anesthesia Checklist: Patient identified, Emergency Drugs available, Suction available and Patient being monitored Patient Re-evaluated:Patient Re-evaluated prior to induction Oxygen Delivery Method: Circle system utilized Preoxygenation: Pre-oxygenation with 100% oxygen Induction Type: IV induction and Rapid sequence Ventilation: Mask ventilation without difficulty Laryngoscope Size: McGraph and 3 Grade View: Grade I Tube type: Oral Number of attempts: 1 Airway Equipment and Method: Stylet Placement Confirmation: ETT inserted through vocal cords under direct vision, positive ETCO2 and breath sounds checked- equal and bilateral Secured at: 21 cm Tube secured with: Tape Dental Injury: Teeth and Oropharynx as per pre-operative assessment

## 2020-12-17 NOTE — Procedures (Signed)
Pre Procedure Dx: Right sided pelvic stone. Post Procedural Dx: Same  Successful fluoroscopic guided placement of a right sided 5 Fr catheter past large pelvic stone with tip terminating within the urinary bladder to be used during impending PCNL.  EBL: Minimal Complications: None immediate.  Ronny Bacon, MD Pager #: [phone]

## 2020-12-17 NOTE — Op Note (Signed)
Date of procedure: 12/17/20  Preoperative diagnosis:  Right renal atrophy Right renal pelvic stone  Postoperative diagnosis:  Same as above  Procedure: Right percutaneous nephrolithotomy Right nephroureteral catheter exchange Right nephrostomy tube placement Right antegrade nephrostogram Right antegrade ureteroscopy Interpretation of fluoroscopy less than 30 minutes  Surgeon: Hollice Espy, MD  Anesthesia: General  Complications: None  Intraoperative findings: 2.3 cm right renal pelvic stone with surrounding edema notable, few small fragments in proximal ureter extracted via basket.  EBL: Minimal  Specimens: Stone fragment  Drains: 77 French Foley catheter per urethra, 23 [place] council tip catheter as nephrostomy tube, 5 [place] open-ended nephroureteral catheter  Indication: Melanie Hubbard is a 65 y.o. patient with chronic 2.3 cm right renal pelvic stone and a fairly atrophic kidney.  After reviewing the management options for treatment, she elected to proceed with the above surgical procedure(s). We have discussed the potential benefits and risks of the procedure, side effects of the proposed treatment, the likelihood of the patient achieving the goals of the procedure, and any potential problems that might occur during the procedure or recuperation. Informed consent has been obtained.  Description of procedure:  The patient was taken to the operating room and general anesthesia was induced were induced on the stretcher.  She was then intubated and received perioperative antibiotics.  She was then repositioned in the prone position on the operating room table and carefully strapped and all pressure points were padded.  She was then prepped and draped in the standard sterile fashion.  At this point time, scout imaging revealed a nephroureteral catheter previously placed by interventional radiology all the way down to the bladder traversing alongside of a large 2.3 cm right  renal pelvic stone.  There is no other stones appreciated.  At this point time, I cannulated the open-ended ureteral catheter with a Super Stiff wire down to level the bladder and remove the open-ended ureteral catheter using a Seldinger technique.  I then I distended the incision to approximately 1.5 cm transversely using a knife and incised the fascia using a fascial incisor needle over the wire and a cruciate incision.  I then use a 8 French introducer to introduce a second Super Stiff wire down to the level of the bladder.  All of this went quite easily.  I then used a NephroMax balloon positioning it just adjacent to the stone and inflated the balloon to 18 cm of water.  I advanced the sheath over the balloon and allowed it to remain in place for several minutes while setting up the nephroscope.  I then advanced a nephroscope but on first inspection, noted that this sheath was not quite far enough that they really placed the balloon and readvanced the sheath over the balloon to the adequate position.  This time, upon introducing the nephroscope, is right on top of the stone.  The lithoclast device was brought in and the entirety of the stone was evacuated from the renal pelvis.  There is a good amount of surrounding edema but otherwise the collecting system was unremarkable.  There are few small fragments that appeared to traverse down the proximal ureter.  Once the entire renal pelvis had been cleared, I injected contrast through the scope and I did not see any contrast on the proximal ureter concern for occlusion from the small fragment.  I tried to advance a flexible cystoscope but did this unsuccessfully.  Ultimately, I was able to advance a 8 [place] digital flexible ureteroscope down all the way  to the level of the iliacs initially over a third wire.  Upon pulling the scope back, I did identify a few fragments which were able to be removed using a 1.9 [place] tipless nitinol basket.  Finally, both visually  and fluoroscopically, no additional stone fragment was identified.  I removed the scope but left this third wire in place over which I advanced a 5 French nephroureteral catheter back down to the level in the bladder.  I used the other wire to advance a 40 [place] council tip catheter into the renal pelvis.  The balloon was inflated with 3 cc of diluted contrast.  I then backed the sheath out and perform 1 last antegrade nephrostogram which showed no contrast extravasation other than through the nephrostomy tube tract which was minimal as well as good filling of the entirety of the collecting system and ureter which is reassuring.  The nephroureteral catheter and nephrostomy tube were secured using silk suture x2.  Removed the final safety wire and all tubes appear to be in adequate position.  A dressing of 4 x 4's, and ABD pad and foam tape was applied.  Patient was then repositioned in the supine position back on the stretcher and extubated.  She was taken to PACU in stable condition.    Plan: She will stay overnight for observation.  We will plan to clamp her nephrostomy tube and remove her Foley in the morning if she tolerates this, we will remove her nephrostomy tube midday and plan for discharge.  Hollice Espy, M.D.

## 2020-12-17 NOTE — Progress Notes (Signed)
Patient clinically stable post PCNL placement per Dr Pascal Lux, tolerated well. Vitals stable pre and post procedure. Received Versed 1 mg along with Fentanyl 100 mcg IV for procedure. Post procedure, transport to PACU 14 for OR with bedside report given to [employer].

## 2020-12-17 NOTE — Final Progress Note (Signed)
Patietn denies use of cocaine and has not used marijuana for at least 2 weeks.  Discussed with Dr. Bertell Maria, Urine drug screen cancelled.  Per patient request belongings given to S.O. Charlie.

## 2020-12-17 NOTE — Transfer of Care (Signed)
Immediate Anesthesia Transfer of Care Note  Patient: Cloyde Reams  Procedure(s) Performed: NEPHROLITHOTOMY PERCUTANEOUS (Right)  Patient Location: PACU  Anesthesia Type:General  Level of Consciousness: drowsy  Airway & Oxygen Therapy: Patient Spontanous Breathing and Patient connected to face mask oxygen  Post-op Assessment: Report given to RN and Post -op Vital signs reviewed and stable  Post vital signs: Reviewed and stable  Last Vitals:  Vitals Value Taken Time  BP 157/74 12/17/20 1219  Temp    Pulse 64 12/17/20 1223  Resp 18 12/17/20 1223  SpO2 98 % 12/17/20 1223  Vitals shown include unvalidated device data.  Last Pain:  Vitals:   12/17/20 0743  TempSrc: Oral  PainSc: 10-Worst pain ever         Complications: No notable events documented.

## 2020-12-17 NOTE — Anesthesia Postprocedure Evaluation (Signed)
Anesthesia Post Note  Patient: Melanie Hubbard  Procedure(s) Performed: NEPHROLITHOTOMY PERCUTANEOUS (Right)  Patient location during evaluation: PACU Anesthesia Type: General Level of consciousness: awake and alert Pain management: pain level controlled Vital Signs Assessment: post-procedure vital signs reviewed and stable Respiratory status: spontaneous breathing, nonlabored ventilation, respiratory function stable and patient connected to nasal cannula oxygen Cardiovascular status: blood pressure returned to baseline and stable Postop Assessment: no apparent nausea or vomiting Anesthetic complications: no   No notable events documented.   Last Vitals:  Vitals:   12/17/20 1313 12/17/20 1319  BP: (!) 177/63 (!) 141/96  Pulse:  62  Resp:  14  Temp:    SpO2:  99%    Last Pain:  Vitals:   12/17/20 1339  TempSrc:   PainSc: 4                  Arita Miss

## 2020-12-17 NOTE — Progress Notes (Signed)
Patient continues to ask to have foley removed. Dr Erlene Quan notified and said that the foley must stay in for healing. Patient continues to get up on her own and foley bag does fall on the floor and gets pulled on. I have educated the patient about this

## 2020-12-17 NOTE — Anesthesia Preprocedure Evaluation (Addendum)
Anesthesia Evaluation  Patient identified by MRN, date of birth, ID band Patient awake  General Assessment Comment:  Patient cocaine positive last year.  I explained increased risk of CV issues and death if actively cocaine using. Patient denies using cocaine currently  Reviewed: Allergy & Precautions, NPO status , Patient's Chart, lab work & pertinent test results  History of Anesthesia Complications (+) AWARENESS UNDER ANESTHESIA and history of anesthetic complications  Airway Mallampati: III  TM Distance: >3 FB Neck ROM: Limited   Comment: Very limited neck extension Dental  (+) Edentulous Lower, Edentulous Upper   Pulmonary asthma , neg sleep apnea, COPD,  COPD inhaler, Current SmokerPatient did not abstain from smoking.,  Patient has oxygen at home but never uses it    + decreased breath sounds      Cardiovascular Exercise Tolerance: Poor METShypertension, Pt. on medications (-) CAD and (-) Past MI (-) dysrhythmias  Rhythm:Regular Rate:Normal - Systolic murmurs Unremarkable nuclear stress test in 2017   Neuro/Psych PSYCHIATRIC DISORDERS Anxiety Depression  Neuromuscular disease    GI/Hepatic neg GERD  ,(+)     (-) substance abuse  ,   Endo/Other  neg diabetesMorbid obesity  Renal/GU negative Renal ROS     Musculoskeletal  (+) Arthritis , S/p neck fusions   Abdominal (+) + obese,   Peds  Hematology   Anesthesia Other Findings Past Medical History: No date: Anxiety No date: Asthma 2004: Cardiac arrest [facility])     Comment:  Pt states "it took them 14 minutes to get me back" 1990: Cervical cancer ([place]) No date: Complication of anesthesia     Comment:  woke up during oral surgery and c-section No date: COPD (chronic obstructive pulmonary disease) ([facility]) No date: Depression No date: History of kidney stones No date: Hypertension No date: Lupus ([place]) No date: Rheumatoid arthritis ([facility]) No date: Swallowing  difficulty     Comment:  due to having esophageal polyps removed and pt states               the surgeon messed her swallowing up No date: Vertigo  Reproductive/Obstetrics                            Anesthesia Physical Anesthesia Plan  ASA: 3  Anesthesia Plan: General   Post-op Pain Management:    Induction: Intravenous and Rapid sequence  PONV Risk Score and Plan: 4 or greater and Ondansetron, Dexamethasone, Treatment may vary due to age or medical condition and Midazolam  Airway Management Planned: Oral ETT and Video Laryngoscope Planned  Additional Equipment: None  Intra-op Plan:   Post-operative Plan: Extubation in OR  Informed Consent: I have reviewed the patients History and Physical, chart, labs and discussed the procedure including the risks, benefits and alternatives for the proposed anesthesia with the patient or authorized representative who has indicated his/her understanding and acceptance.     Dental advisory given  Plan Discussed with: CRNA and Surgeon  Anesthesia Plan Comments: (Discussed risks of anesthesia with patient, including PONV, sore throat, lip/dental damage. Rare risks discussed as well, such as cardiorespiratory and neurological sequelae, and allergic reactions. Discussed patient's higher risk of airway complications due to potential difficult airway (limited neck extension). Will have difficult airway equipment in the room. Patient understands.)        Anesthesia Quick Evaluation

## 2020-12-17 NOTE — Progress Notes (Signed)
Patient does not call out for assistance when she gets up. I explained that staff needs to be with her when she is getting up. She insisted on sitting on the toilet even though I explained to her that she has a foley.

## 2020-12-17 NOTE — H&P (Signed)
Chief Complaint: Patient was seen in consultation today for nephrostomy tube placement prior to OR procedure for kidney stones.   Referring Physician(s): Hollice Espy  Supervising Physician: Sandi Mariscal  Patient Status: [facility] - Out-pt  History of Present Illness: Melanie Hubbard is a 65 y.o. female with a medical history significant for anxiety/depression, asthma, COPD, HTN, lupus, cardiac arrest (2004) and kidney stones. She has had chronic right flank/low back pain and was found to have a 2.3 cm non-obstructing right renal pelvic stone. Her urologist, Dr. Erlene Quan, has recommended right PCNL. Dr. Erlene Quan has asked Interventional Radiology to evaluate this patient for a right percutaneous nephrostomy tube to help facilitate today's OR procedure.   Past Medical History:  Diagnosis Date   Anxiety    Asthma    Cardiac arrest [facility]) 2004   Pt states "it took them 14 minutes to get me back"   Cervical cancer ([place]) 0000000   Complication of anesthesia    woke up during oral surgery and c-section   COPD (chronic obstructive pulmonary disease) ([place])    Depression    History of kidney stones    Hypertension    Lupus ([place])    Rheumatoid arthritis ([place])    Swallowing difficulty    due to having esophageal polyps removed and pt states the surgeon messed her swallowing up   Vertigo     Past Surgical History:  Procedure Laterality Date   CATARACT EXTRACTION Right    CERVICAL ABLATION  1990   freeze/burn laser due to cervical cancer   CERVICAL FUSION     PLATE   CESAREAN SECTION     1980, 1994   CHOLECYSTECTOMY     HEEL SPUR SURGERY     LUMBAR FUSION     L4-L5 screws and rod   ROTATOR CUFF REPAIR Left     Allergies: Latex  Medications: Prior to Admission medications   Medication Sig Start Date End Date Taking? Authorizing Provider  albuterol (PROVENTIL) (2.5 MG/3ML) 0.083% nebulizer solution Take 2.5 mg by nebulization every 6 (six) hours as needed for wheezing or  shortness of breath.    [provider]  ALPRAZolam Duanne Moron) 1 MG tablet Take 1 mg by mouth as needed for anxiety.    [provider]  clonazePAM (KLONOPIN) 1 MG tablet Take 1 mg by mouth 2 (two) times daily as needed for anxiety. Patient not taking: Reported on 12/14/2020    [provider]  cyclobenzaprine (FLEXERIL) 10 MG tablet Take 10 mg by mouth in the morning and at bedtime.    [provider]  enalapril (VASOTEC) 20 MG tablet Take 20 mg by mouth daily as needed (elevated blood pressure due to pain/stress).    [provider]  gabapentin (NEURONTIN) 600 MG tablet Take 600 mg by mouth 3 (three) times daily as needed (nerve pain.).    [provider]  Ipratropium-Albuterol (COMBIVENT RESPIMAT) 20-100 MCG/ACT AERS respimat Inhale 1 puff into the lungs every 6 (six) hours as needed for wheezing or shortness of breath.    [provider]  meclizine (ANTIVERT) 25 MG tablet Take 1 tablet (25 mg total) by mouth 3 (three) times daily as needed for dizziness or nausea. 05/17/20   Carrie Mew, MD  oxyCODONE (OXY IR/ROXICODONE) 5 MG immediate release tablet Take 20 mg by mouth 4 (four) times daily as needed for severe pain.    [provider]  Oxycodone HCl 20 MG TABS Take 20 mg by mouth 4 (four) times daily as  needed (pain.).    [provider]  OXYGEN Inhale 4 L into the lungs as needed (breathing issues.).    [provider]     Family History  Adopted: Yes  Family history unknown: Yes    Social History   Socioeconomic History   Marital status: Single    Spouse name: Not on file   Number of children: Not on file   Years of education: Not on file   Highest education level: Not on file  Occupational History   Not on file  Tobacco Use   Smoking status: Every Day    Packs/day: 0.25    Types: Cigarettes   Smokeless tobacco: Never  Vaping Use   Vaping Use: Never used  Substance and Sexual Activity    Alcohol use: Yes    Comment: rarely   Drug use: Yes    Types: Marijuana, Cocaine   Sexual activity: Yes    Birth control/protection: Post-menopausal  Other Topics Concern   Not on file  Social History Narrative   From [place], moved to [place] in 2021. Lives with boyfriend   Social Determinants of Health   Financial Resource Strain: Not on file  Food Insecurity: Not on file  Transportation Needs: Not on file  Physical Activity: Not on file  Stress: Not on file  Social Connections: Not on file    Review of Systems: A 12 point ROS discussed and pertinent positives are indicated in the HPI above.  All other systems are negative.  Review of Systems  Constitutional:  Positive for fatigue. Negative for appetite change.  Respiratory:  Negative for cough and shortness of breath.   Cardiovascular:  Negative for chest pain and leg swelling.  Gastrointestinal:  Negative for abdominal pain, diarrhea, nausea and vomiting.  Genitourinary:  Positive for flank pain.  Neurological:  Negative for headaches.   Vital Signs: 98.2, 119/106, HR 56, 16 RR, 98% RA  Physical Exam Constitutional:      General: She is not in acute distress. HENT:     Mouth/Throat:     Mouth: Mucous membranes are moist.     Pharynx: Oropharynx is clear.  Cardiovascular:     Rate and Rhythm: Normal rate and regular rhythm.  Pulmonary:     Effort: Pulmonary effort is normal.     Breath sounds: Normal breath sounds.  Abdominal:     General: Bowel sounds are normal.     Palpations: Abdomen is soft.  Skin:    General: Skin is warm and dry.  Neurological:     Mental Status: She is alert and oriented to person, place, and time.    Imaging: No results found.  Labs:  CBC: Recent Labs    05/17/20 1141 10/14/20 1342 12/11/20 1053 12/17/20 0732  WBC 5.7 7.6 6.0 4.2  HGB 14.4 14.3 14.4 18.4*  HCT 41.0 42.7 39.0 55.2*  PLT 183 194 175 184    COAGS: Recent Labs    12/11/20 1053 12/17/20 0732  INR 0.9  0.8    BMP: Recent Labs    05/17/20 1141 10/14/20 1342 12/11/20 1053 12/17/20 0732  NA 137 138 140 139  K 4.3 4.2 3.4* 4.2  CL 106 105 103 105  CO2 '25 27 30 27  '$ GLUCOSE 115* 97 87 96  BUN '22 19 14 18  '$ CALCIUM 9.3 9.3 9.1 9.1  CREATININE 1.13* 0.91 0.92 0.86  GFRNONAA 55* >60 >60 >60    LIVER FUNCTION TESTS: No results for input(s): BILITOT,  AST, ALT, ALKPHOS, PROT, ALBUMIN in the last 8760 hours.  TUMOR MARKERS: No results for input(s): AFPTM, CEA, CA199, CHROMGRNA in the last 8760 hours.  Assessment and Plan:  Right kidney stone; PCNL with urology today: Melanie Hubbard, 65 year old female, presents today to the [facility] Interventional Radiology department for a right percutaneous nephrostomy tube placement to help facilitate today's urological procedure in the OR.   Risks and benefits of right PCN placement was discussed with the patient including, but not limited to, infection, bleeding, significant bleeding causing loss or decrease in renal function or damage to adjacent structures.   All of the patient's questions were answered, patient is agreeable to proceed.  Consent signed and in chart.  Thank you for this interesting consult.  I greatly enjoyed meeting Melanie Hubbard and look forward to participating in their care.  A copy of this report was sent to the requesting provider on this date.  Electronically Signed: Soyla Dryer, AGACNP-BC [phone] 12/17/2020, 8:45 AM   I spent a total of  30 Minutes   in face to face in clinical consultation, greater than 50% of which was counseling/coordinating care for right PCN

## 2020-12-18 ENCOUNTER — Other Ambulatory Visit: Payer: Self-pay | Admitting: Physician Assistant

## 2020-12-18 ENCOUNTER — Encounter: Payer: Self-pay | Admitting: Urology

## 2020-12-18 DIAGNOSIS — N261 Atrophy of kidney (terminal): Secondary | ICD-10-CM | POA: Diagnosis not present

## 2020-12-18 DIAGNOSIS — N200 Calculus of kidney: Secondary | ICD-10-CM

## 2020-12-18 LAB — CBC
HCT: 39.4 % (ref 36.0–46.0)
Hemoglobin: 14.3 g/dL (ref 12.0–15.0)
MCH: 33.6 pg (ref 26.0–34.0)
MCHC: 36.3 g/dL — ABNORMAL HIGH (ref 30.0–36.0)
MCV: 92.7 fL (ref 80.0–100.0)
Platelets: 215 10*3/uL (ref 150–400)
RBC: 4.25 MIL/uL (ref 3.87–5.11)
RDW: 15.2 % (ref 11.5–15.5)
WBC: 13.6 10*3/uL — ABNORMAL HIGH (ref 4.0–10.5)
nRBC: 0 % (ref 0.0–0.2)

## 2020-12-18 LAB — BASIC METABOLIC PANEL
Anion gap: 7 (ref 5–15)
BUN: 20 mg/dL (ref 8–23)
CO2: 29 mmol/L (ref 22–32)
Calcium: 9.2 mg/dL (ref 8.9–10.3)
Chloride: 101 mmol/L (ref 98–111)
Creatinine, Ser: 1.12 mg/dL — ABNORMAL HIGH (ref 0.44–1.00)
GFR, Estimated: 56 mL/min — ABNORMAL LOW (ref >60)
Glucose, Bld: 148 mg/dL — ABNORMAL HIGH (ref 70–99)
Potassium: 4.6 mmol/L (ref 3.5–5.1)
Sodium: 137 mmol/L (ref 135–145)

## 2020-12-18 MED ORDER — TAMSULOSIN HCL 0.4 MG PO CAPS: 0.4000 mg | ORAL_CAPSULE | Freq: Every day | ORAL | 0 refills | Status: DC

## 2020-12-18 MED ORDER — OXYCODONE HCL 5 MG PO TABS: 5.0000 mg | ORAL_TABLET | Freq: Four times a day (QID) | ORAL | 0 refills | Status: DC | PRN

## 2020-12-18 MED ORDER — DOCUSATE SODIUM 100 MG PO CAPS: 100.0000 mg | ORAL_CAPSULE | Freq: Two times a day (BID) | ORAL | 0 refills | Status: DC

## 2020-12-18 NOTE — Plan of Care (Signed)

## 2020-12-18 NOTE — Progress Notes (Signed)
AVS reviewed and pt verbalized understanding of all discharge instruction. NAD noted at this time or voiced concerns.

## 2020-12-18 NOTE — TOC CM/SW Note (Signed)
Patient has orders to discharge home today. Chart reviewed. PCP is [facility]. On room air. Has incision on right flank (ABD). No TOC needs identified. CSW signing off.  Dayton Scrape, [place]

## 2020-12-18 NOTE — Discharge Summary (Signed)
Date of admission: 12/17/2020  Date of discharge: 12/18/2020  Admission diagnosis: Right renal atrophy, right renal pelvic stone  Discharge diagnosis: Same as above  Secondary diagnoses:  Patient Active Problem List   Diagnosis Date Noted   Right renal stone 12/17/2020   History and Physical: For full details, please see admission history and physical. Briefly, Melanie Hubbard is a 65 y.o. year old patient admitted on 12/17/2020 for scheduled PCNL with Dr. Erlene Quan for management of a large right renal pelvic stone.  Today she reports dissatisfaction with her quality of care. She believes her Foley catheter was improperly placed. She does not believe nursing has been responsive to her needs overnight. She wants her catheter and nephrostomy tubes removed immediately. She wants to be discharged this morning. She says she considered leaving AMA overnight.  She denies nausea and vomiting and has been tolerating PO. Foley catheter and right NT in place draining dark pink urine. She has been ambulatory. She is afebrile, VSS. AM labs with normal postoperative findings including increased creatinine 1.12, increased WBC count 13.6, and stable Hgb 14.3. Foley removed and NT plugged in the morning. Patient tolerated well.  At midday, she reported no new acute right flank pain. Right NT removed without difficulty; patient tolerated well.  Physical Exam: Constitutional:  Alert and oriented, no acute distress, nontoxic appearing HEENT: , AT Cardiovascular: No clubbing, cyanosis, or edema Respiratory: Normal respiratory effort, no increased work of breathing Skin: No rashes, bruises or suspicious lesions Neurologic: Grossly intact, no focal deficits, moving all 4 extremities Psychiatric: Argumentative  Hospital Course: Patient tolerated the procedure well.  She was then transferred to the floor after an uneventful PACU stay.  Her hospital course was uncomplicated.  On POD#1 she had met discharge  criteria: was eating a regular diet, was up and ambulating independently,  pain was well controlled, was voiding without a catheter, tolerated a clamping trial and NT was removed, and was ready for discharge.  Laboratory values:  Recent Labs    12/17/20 0732 12/18/20 0627  WBC 4.2 13.6*  HGB 18.4* 14.3  HCT 55.2* 39.4   Recent Labs    12/17/20 0732 12/18/20 0627  NA 139 137  K 4.2 4.6  CL 105 101  CO2 27 29  GLUCOSE 96 148*  BUN 18 20  CREATININE 0.86 1.12*  CALCIUM 9.1 9.2   Recent Labs    12/17/20 0732  INR 0.8   Results for orders placed or performed during the hospital encounter of 12/14/20  SARS CORONAVIRUS 2 (TAT 6-24 HRS) Nasopharyngeal Nasopharyngeal Swab     Status: None   Collection Time: 12/14/20 10:30 AM   Specimen: Nasopharyngeal Swab  Result Value Ref Range Status   SARS Coronavirus 2 NEGATIVE NEGATIVE Final    Comment: (NOTE) SARS-CoV-2 target nucleic acids are NOT DETECTED.  The SARS-CoV-2 RNA is generally detectable in upper and lower respiratory specimens during the acute phase of infection. Negative results do not preclude SARS-CoV-2 infection, do not rule out co-infections with other pathogens, and should not be used as the sole basis for treatment or other patient management decisions. Negative results must be combined with clinical observations, patient history, and epidemiological information. The expected result is Negative.  Fact Sheet for Patients: SugarRoll.be  Fact Sheet for Healthcare Providers: https://www.woods-mathews.com/  This test is not yet approved or cleared by the [place] FDA and  has been authorized for detection and/or diagnosis of SARS-CoV-2 by FDA under an Emergency Use Authorization (EUA). This EUA  will remain  in effect (meaning this test can be used) for the duration of the COVID-19 declaration under Se ction 564(b)(1) of the Act, 21 U.S.C. section 360bbb-3(b)(1),  unless the authorization is terminated or revoked sooner.  Performed at [facility], [place] [address] [place], [place] [address]    Disposition: Home  Discharge instruction: The patient was instructed to be ambulatory but told to refrain from heavy lifting, strenuous activity, or driving. We discussed dressing changes at her right NT site and keeping the bandage watertight for about one week. We discussed that urine leakage from this site is normal for about one week after surgery until the tract closes.  Discharge medications:  Allergies as of 12/18/2020       Reactions   Latex Itching        Medication List     TAKE these medications    albuterol (2.5 MG/3ML) 0.083% nebulizer solution Commonly known as: PROVENTIL Take 2.5 mg by nebulization every 6 (six) hours as needed for wheezing or shortness of breath.   ALPRAZolam 1 MG tablet Commonly known as: XANAX Take 1 mg by mouth as needed for anxiety.   clonazePAM 1 MG tablet Commonly known as: KLONOPIN Take 1 mg by mouth 2 (two) times daily as needed for anxiety.   Combivent Respimat 20-100 MCG/ACT Aers respimat Generic drug: Ipratropium-Albuterol Inhale 1 puff into the lungs every 6 (six) hours as needed for wheezing or shortness of breath.   cyclobenzaprine 10 MG tablet Commonly known as: FLEXERIL Take 10 mg by mouth in the morning and at bedtime.   docusate sodium 100 MG capsule Commonly known as: COLACE Take 1 capsule (100 mg total) by mouth 2 (two) times daily.   enalapril 20 MG tablet Commonly known as: VASOTEC Take 20 mg by mouth daily as needed (elevated blood pressure due to pain/stress).   gabapentin 600 MG tablet Commonly known as: NEURONTIN Take 600 mg by mouth 3 (three) times daily as needed (nerve pain.).   meclizine 25 MG tablet Commonly known as: ANTIVERT Take 1 tablet (25 mg total) by mouth 3 (three) times daily as needed for dizziness or nausea.   oxyCODONE 5 MG immediate release  tablet Commonly known as: Oxy IR/ROXICODONE Take 20 mg by mouth 4 (four) times daily as needed for severe pain.   Oxycodone HCl 20 MG Tabs Take 20 mg by mouth 4 (four) times daily as needed (pain.).   OXYGEN Inhale 4 L into the lungs as needed (breathing issues.).   tamsulosin 0.4 MG Caps capsule Commonly known as: FLOMAX Take 1 capsule (0.4 mg total) by mouth daily.        Followup:   Follow-up Information     Hollice Espy, MD. Go in 4 week(s).   Specialty: Urology Why: For postop follow-up with renal ultrasound prior Contact information: [place] [address] [phone]

## 2020-12-19 LAB — CULTURE, URINE COMPREHENSIVE

## 2020-12-21 LAB — CALCULI, WITH PHOTOGRAPH (CLINICAL LAB)
Calcium Oxalate Dihydrate: 20 %
Calcium Oxalate Monohydrate: 75 %
Hydroxyapatite: 5 %
Weight Calculi: 63 mg

## 2021-01-10 ENCOUNTER — Ambulatory Visit: Admission: RE | Admit: 2021-01-10 | Payer: Medicaid Other | Source: Ambulatory Visit

## 2021-01-15 ENCOUNTER — Encounter: Payer: Medicaid Other | Admitting: Urology

## 2021-01-16 ENCOUNTER — Ambulatory Visit: Admission: RE | Admit: 2021-01-16 | Payer: Medicaid Other | Source: Ambulatory Visit

## 2021-01-17 ENCOUNTER — Ambulatory Visit: Payer: Medicaid Other

## 2021-01-23 ENCOUNTER — Encounter: Payer: Medicaid Other | Admitting: Urology

## 2021-01-24 ENCOUNTER — Ambulatory Visit: Payer: Medicaid Other | Attending: Physician Assistant

## 2021-02-05 ENCOUNTER — Encounter: Payer: Medicaid Other | Admitting: Urology

## 2021-02-06 ENCOUNTER — Other Ambulatory Visit: Payer: Self-pay

## 2021-02-06 ENCOUNTER — Ambulatory Visit
Admission: RE | Admit: 2021-02-06 | Discharge: 2021-02-06 | Disposition: A | Discharge status: Home / Self Care | Payer: Medicaid Other | Source: Ambulatory Visit | Attending: Physician Assistant | Admitting: Physician Assistant

## 2021-02-06 DIAGNOSIS — N200 Calculus of kidney: Secondary | ICD-10-CM | POA: Insufficient documentation

## 2021-02-06 NOTE — Progress Notes (Signed)
02/07/21 11:11 AM   Melanie Hubbard [birth year] [id number]  Referring provider:  [facility] [address] [place],  [place] [address] Chief Complaint  Patient presents with   Results    4wk follow up w/RUS     HPI: Melanie Hubbard is a 65 y.o.female with a personal of right kidney stone, right renal atrophy, and right flank pain, who presents today for 4 week follow-up with RUS prior.   10/14/2020 CT revealed indicating a large 2.3 cm right renal pelvic stone with out hydronephrosis.  We have no previous imaging for comparison.  There is some chronic atrophy in the right kidney.   She underwent right percutaneous nephrolithotomy on 12/17/2020. Intraoperative findings showed  2.3 cm right renal pelvic stone with surrounding edema notable, few small fragments in proximal ureter extracted via basket.  Stone analysis revealed 75% calcium oxalate monohydrate, 20% calcium oxalate dihydrate, and 5% hydroxyapatite.   I personally reviewed RUS today, radiology has not reviewed this yet.  No evidence of hydronephrosis.  Renal atrophy appreciated.  No significant stone burden.  She reports today that she has been experiencing urgency. This has started to get better.  No overt flank pain.  Her chronic pain is now more midline consistent with back pain.  PMH: Past Medical History:  Diagnosis Date   Anxiety    Asthma    Cardiac arrest [facility]) 2004   Pt states "it took them 14 minutes to get me back"   Cervical cancer ([place]) 5188   Complication of anesthesia    woke up during oral surgery and c-section   COPD (chronic obstructive pulmonary disease) ([place])    Depression    History of kidney stones    Hypertension    Lupus ([place])    Rheumatoid arthritis ([place])    Swallowing difficulty    due to having esophageal polyps removed and pt states the surgeon messed her swallowing up   Vertigo     Surgical History: Past Surgical History:  Procedure Laterality Date    CATARACT EXTRACTION Right    CERVICAL ABLATION  1990   freeze/burn laser due to cervical cancer   CERVICAL FUSION     PLATE   CESAREAN SECTION     1980, [place]     IR URETERAL STENT RIGHT NEW ACCESS W/O SEP NEPHROSTOMY CATH  12/17/2020   LUMBAR FUSION     L4-L5 screws and rod   NEPHROLITHOTOMY Right 12/17/2020   Procedure: NEPHROLITHOTOMY PERCUTANEOUS;  Surgeon: Hollice Espy, MD;  Location: [facility] ORS;  Service: Urology;  Laterality: Right;   ROTATOR CUFF REPAIR Left     Home Medications:  Allergies as of 02/07/2021       Reactions   Latex Itching        Medication List        Accurate as of February 07, 2021 11:11 AM. If you have any questions, ask your nurse or doctor.          STOP taking these medications    docusate sodium 100 MG capsule Commonly known as: COLACE   meclizine 25 MG tablet Commonly known as: ANTIVERT   tamsulosin 0.4 MG Caps capsule Commonly known as: FLOMAX       TAKE these medications    albuterol (2.5 MG/3ML) 0.083% nebulizer solution Commonly known as: PROVENTIL Take 2.5 mg by nebulization every 6 (six) hours as needed for wheezing or shortness of breath.  ALPRAZolam 1 MG tablet Commonly known as: XANAX Take 1 mg by mouth as needed for anxiety.   clonazePAM 1 MG tablet Commonly known as: KLONOPIN Take 1 mg by mouth 2 (two) times daily as needed for anxiety.   Combivent Respimat 20-100 MCG/ACT Aers respimat Generic drug: Ipratropium-Albuterol Inhale 1 puff into the lungs every 6 (six) hours as needed for wheezing or shortness of breath.   cyclobenzaprine 10 MG tablet Commonly known as: FLEXERIL Take 10 mg by mouth in the morning and at bedtime.   enalapril 20 MG tablet Commonly known as: VASOTEC Take 20 mg by mouth daily as needed (elevated blood pressure due to pain/stress).   gabapentin 600 MG tablet Commonly known as: NEURONTIN Take 600 mg by mouth 3 (three) times daily as  needed (nerve pain.).   Oxycodone HCl 20 MG Tabs Take 20 mg by mouth 4 (four) times daily as needed (pain.). What changed: Another medication with the same name was removed. Continue taking this medication, and follow the directions you see here.   OXYGEN Inhale 4 L into the lungs as needed (breathing issues.).        Allergies:  Allergies  Allergen Reactions   Latex Itching    Family History: Family History  Adopted: Yes  Family history unknown: Yes    Social History:  reports that she has been smoking cigarettes. She has been smoking an average of .25 packs per day. She has never used smokeless tobacco. She reports current alcohol use. She reports current drug use. Drugs: Marijuana and Cocaine.   Physical Exam: BP (!) 150/80   Pulse 76   Ht 5' (1.524 m)   Wt 203 lb (92.1 kg)   BMI 39.65 kg/m   Constitutional:  Alert and oriented, No acute distress.  Accompanied by female companion today. HEENT: [place] AT, moist mucus membranes.  Trachea midline, no masses. Cardiovascular: No clubbing, cyanosis, or edema. Respiratory: Normal respiratory effort, no increased work of breathing. Skin: No rashes, bruises or suspicious lesions. Neurologic: Grossly intact, no focal deficits, moving all 4 extremities. Psychiatric: Normal mood and affect.  Laboratory Data:  Lab Results  Component Value Date   CREATININE 1.12 (H) 12/18/2020    Assessment & Plan:  History of right kidney stone  - S/p  right percutaneous nephrolithotomy  - I personally reviewed renal ultrasound today, no obvious pathology other than known right renal atrophy - We discussed general stone prevention techniques including drinking plenty water with goal of producing 2.5 L urine daily, increased citric acid intake, avoidance of high oxalate containing foods, and decreased salt intake.  Information about dietary recommendations given today.    Follow-up in 1 year with KUB  I,Kailey Littlejohn,acting as a scribe  for Hollice Espy, MD.,have documented all relevant documentation on the behalf of Hollice Espy, MD,as directed by  Hollice Espy, MD while in the presence of Hollice Espy, MD.  I have reviewed the above documentation for accuracy and completeness, and I agree with the above.   Hollice Espy, MD   [facility] [address] [place], [place] [phone]

## 2021-02-07 ENCOUNTER — Ambulatory Visit (INDEPENDENT_AMBULATORY_CARE_PROVIDER_SITE_OTHER): Payer: Medicaid Other | Admitting: Urology

## 2021-02-07 VITALS — BP 150/80 | HR 76 | Ht 60.0 in | Wt 203.0 lb

## 2021-02-07 DIAGNOSIS — N200 Calculus of kidney: Secondary | ICD-10-CM

## 2021-03-29 ENCOUNTER — Ambulatory Visit: Payer: Medicaid Other | Admitting: Physician Assistant

## 2021-03-29 ENCOUNTER — Encounter: Payer: Self-pay | Admitting: Physician Assistant

## 2021-04-15 DIAGNOSIS — Z789 Other specified health status: Secondary | ICD-10-CM | POA: Insufficient documentation

## 2021-04-15 DIAGNOSIS — M899 Disorder of bone, unspecified: Secondary | ICD-10-CM | POA: Insufficient documentation

## 2021-04-15 DIAGNOSIS — G894 Chronic pain syndrome: Secondary | ICD-10-CM | POA: Insufficient documentation

## 2021-04-15 DIAGNOSIS — Z79899 Other long term (current) drug therapy: Secondary | ICD-10-CM | POA: Insufficient documentation

## 2021-04-15 NOTE — Progress Notes (Deleted)
No-show to initial appointment

## 2021-04-17 ENCOUNTER — Other Ambulatory Visit: Payer: Self-pay | Admitting: Pain Medicine

## 2021-04-17 ENCOUNTER — Ambulatory Visit: Payer: Medicaid Other | Admitting: Pain Medicine

## 2021-04-17 DIAGNOSIS — M0579 Rheumatoid arthritis with rheumatoid factor of multiple sites without organ or systems involvement: Secondary | ICD-10-CM | POA: Insufficient documentation

## 2021-04-17 DIAGNOSIS — Z91199 Patient's noncompliance with other medical treatment and regimen due to unspecified reason: Secondary | ICD-10-CM

## 2021-04-17 DIAGNOSIS — Z79899 Other long term (current) drug therapy: Secondary | ICD-10-CM

## 2021-04-17 DIAGNOSIS — M329 Systemic lupus erythematosus, unspecified: Secondary | ICD-10-CM | POA: Insufficient documentation

## 2021-04-17 DIAGNOSIS — Z9889 Other specified postprocedural states: Secondary | ICD-10-CM | POA: Insufficient documentation

## 2021-04-17 DIAGNOSIS — Z789 Other specified health status: Secondary | ICD-10-CM

## 2021-04-17 DIAGNOSIS — G894 Chronic pain syndrome: Secondary | ICD-10-CM

## 2021-04-17 DIAGNOSIS — F1491 Cocaine use, unspecified, in remission: Secondary | ICD-10-CM | POA: Insufficient documentation

## 2021-04-17 DIAGNOSIS — M899 Disorder of bone, unspecified: Secondary | ICD-10-CM

## 2021-04-17 DIAGNOSIS — R892 Abnormal level of other drugs, medicaments and biological substances in specimens from other organs, systems and tissues: Secondary | ICD-10-CM | POA: Insufficient documentation

## 2021-04-17 DIAGNOSIS — Z8541 Personal history of malignant neoplasm of cervix uteri: Secondary | ICD-10-CM | POA: Insufficient documentation

## 2021-04-17 NOTE — Progress Notes (Signed)
The patient did not show up to her initial appointment on 04/17/2021.  Historic Controlled Substance Pharmacotherapy Review  PMP and historical list of controlled substances: Oxycodone IR 20 mg tablet, 1 tab p.o. 4 times daily (#120/month) (04/03/2021) ([place]) (Prescriber: Cheri Rous, MD -[facility], [address] [place] [address]) (monthly since 03/21/2020); oxycodone IR 5 mg tablet, 1 tab p.o. 3 times daily (last filled on 12/18/2020) (# 6 tablets) Current opioid analgesics:   Oxycodone IR 20 mg tablet, 1 tab p.o. 4 times daily (#120/month) (04/03/2021) ([place]) MME/day: 120 mg/day  Historical Monitoring: The patient  reports current drug use. Drugs: Marijuana and Cocaine. Historical Background Evaluation: [place] PMP: PDMP reviewed during this encounter. Online review of the past 18-monthperiod conducted.             PMP NARX Score Report:  Narcotic: 080 Sedative: 040 Stimulant: 000 [place] Department of public safety, offender search: (Editor, commissioningInformation) Non-contributory Risk Assessment Profile: PMP NARX Overdose Risk Score: 210  Meds   Current Outpatient Medications:    albuterol (PROVENTIL) (2.5 MG/3ML) 0.083% nebulizer solution, Take 2.5 mg by nebulization every 6 (six) hours as needed for wheezing or shortness of breath., Disp: , Rfl:    ALPRAZolam (XANAX) 1 MG tablet, Take 1 mg by mouth as needed for anxiety. (Patient not taking: Reported on 02/07/2021), Disp: , Rfl:    clonazePAM (KLONOPIN) 1 MG tablet, Take 1 mg by mouth 2 (two) times daily as needed for anxiety. (Patient not taking: No sig reported), Disp: , Rfl:    cyclobenzaprine (FLEXERIL) 10 MG tablet, Take 10 mg by mouth in the morning and at bedtime., Disp: , Rfl:    enalapril (VASOTEC) 20 MG tablet, Take 20 mg by mouth daily as needed (elevated blood pressure due to pain/stress)., Disp: , Rfl:    gabapentin (NEURONTIN) 600 MG tablet, Take 600 mg by mouth 3 (three) times daily as needed (nerve pain.)., Disp: ,  Rfl:    Ipratropium-Albuterol (COMBIVENT RESPIMAT) 20-100 MCG/ACT AERS respimat, Inhale 1 puff into the lungs every 6 (six) hours as needed for wheezing or shortness of breath., Disp: , Rfl:    Oxycodone HCl 20 MG TABS, Take 20 mg by mouth 4 (four) times daily as needed (pain.)., Disp: , Rfl:    OXYGEN, Inhale 4 L into the lungs as needed (breathing issues.)., Disp: , Rfl:   Imaging Review   Complexity Note: No results found under the [place].                         Allergies  Melanie Hubbard is allergic to latex.  Laboratory Chemistry Profile   Renal Lab Results  Component Value Date   BUN 20 12/18/2020   CREATININE 1.12 (H) 12/18/2020   GFRNONAA 56 (L) 12/18/2020   SPECGRAV 1.020 12/14/2020   PHUR 5.5 12/14/2020   PROTEINUR Trace (A) 12/14/2020     Electrolytes Lab Results  Component Value Date   NA 137 12/18/2020   K 4.6 12/18/2020   CL 101 12/18/2020   CALCIUM 9.2 12/18/2020     Hepatic No results found.   ID Lab Results  Component Value Date   SARSCOV2NAA NEGATIVE 12/14/2020     Bone No results found.   Endocrine Lab Results  Component Value Date   GLUCOSE 148 (H) 12/18/2020   GLUCOSEU Negative 12/14/2020     Neuropathy No results found.   CNS No  results found.   Inflammation (CRP: Acute   ESR: Chronic) No results found.   Rheumatology No results found.   Coagulation Lab Results  Component Value Date   INR 0.8 12/17/2020   LABPROT 11.4 12/17/2020   PLT 215 12/18/2020     Cardiovascular Lab Results  Component Value Date   HGB 14.3 12/18/2020   HCT 39.4 12/18/2020     Screening Lab Results  Component Value Date   SARSCOV2NAA NEGATIVE 12/14/2020     Cancer No results found.   Allergens No results found.     Note: Lab results reviewed.  PFSH  Drug: Melanie Hubbard  reports current drug use. Drugs: Marijuana and Cocaine. Alcohol:  reports current alcohol use. Tobacco:  reports that she has been  smoking cigarettes. She has been smoking an average of .25 packs per day. She has never used smokeless tobacco. Medical:  has a past medical history of Anxiety, Asthma, Cardiac arrest ([place]) (2004), Cervical cancer ([place]) (2876), Complication of anesthesia, COPD (chronic obstructive pulmonary disease) ([place]), Depression, History of kidney stones, Hypertension, Lupus ([place]), Rheumatoid arthritis ([place]), Swallowing difficulty, and Vertigo. Family: She was adopted. Family history is unknown by patient.  Past Surgical History:  Procedure Laterality Date   CATARACT EXTRACTION Right    CERVICAL ABLATION  1990   freeze/burn laser due to cervical cancer   CERVICAL FUSION     PLATE   CESAREAN SECTION     1980, 1994   CHOLECYSTECTOMY     HEEL SPUR SURGERY     IR URETERAL STENT RIGHT NEW ACCESS W/O SEP NEPHROSTOMY CATH  12/17/2020   LUMBAR FUSION     L4-L5 screws and rod   NEPHROLITHOTOMY Right 12/17/2020   Procedure: NEPHROLITHOTOMY PERCUTANEOUS;  Surgeon: Hollice Espy, MD;  Location: [facility] ORS;  Service: Urology;  Laterality: Right;   ROTATOR CUFF REPAIR Left    Active Ambulatory Problems    Diagnosis Date Noted   Right renal stone 12/17/2020   Depression with anxiety 11/10/2014   Chronic pain syndrome 04/15/2021   Pharmacologic therapy 04/15/2021   Disorder of skeletal system 04/15/2021   Problems influencing health status 04/15/2021   Lupus ([place])    Rheumatoid arthritis involving multiple sites with positive rheumatoid factor (HCC)    Abnormal drug screen    History of cocaine use    History of cervical cancer    History of lumbar surgery    Resolved Ambulatory Problems    Diagnosis Date Noted   No Resolved Ambulatory Problems   Past Medical History:  Diagnosis Date   Anxiety    Asthma    Cardiac arrest ([place]) 2004   Cervical cancer ([place]) 8115   Complication of anesthesia    COPD (chronic obstructive pulmonary disease) ([place])    Depression    History of kidney stones     Hypertension    Rheumatoid arthritis ([place])    Swallowing difficulty    Vertigo    Constitutional Exam   BMI Assessment: Estimated body mass index is 39.65 kg/m as calculated from the following:   Height as of 02/07/21: 5' (1.524 m).   Weight as of 02/07/21: 203 lb (92.1 kg).  BMI interpretation table: BMI level Category Range association with higher incidence of chronic pain  <18 kg/m2 Underweight   18.5-24.9 kg/m2 Ideal body weight   25-29.9 kg/m2 Overweight Increased incidence by 20%  30-34.9 kg/m2 Obese (Class I) Increased incidence by 68%  35-39.9 kg/m2 Severe obesity (Class II) Increased incidence by 136%  >  40 kg/m2 Extreme obesity (Class III) Increased incidence by 254%   Patient's current BMI Ideal Body weight  There is no height or weight on file to calculate BMI. Patient weight not recorded   BMI Readings from Last 4 Encounters:  02/07/21 39.65 kg/m  12/17/20 40.82 kg/m  12/11/20 40.82 kg/m  10/17/20 41.21 kg/m   Wt Readings from Last 4 Encounters:  02/07/21 203 lb (92.1 kg)  12/17/20 208 lb 15.9 oz (94.8 kg)  12/11/20 209 lb (94.8 kg)  10/17/20 211 lb (95.7 kg)   Note by: Gaspar Cola, MD Date: 04/17/2021; Time: 7:51 AM

## 2021-05-23 ENCOUNTER — Other Ambulatory Visit
Admission: RE | Admit: 2021-05-23 | Discharge: 2021-05-23 | Disposition: A | Discharge status: Home / Self Care | Payer: Medicaid Other | Attending: Dermatology | Admitting: Dermatology

## 2021-05-23 DIAGNOSIS — Z79899 Other long term (current) drug therapy: Secondary | ICD-10-CM | POA: Insufficient documentation

## 2021-05-23 LAB — COMPREHENSIVE METABOLIC PANEL
ALT: 14 U/L (ref 0–44)
AST: 15 U/L (ref 15–41)
Albumin: 3.9 g/dL (ref 3.5–5.0)
Alkaline Phosphatase: 58 U/L (ref 38–126)
Anion gap: 8 (ref 5–15)
BUN: 31 mg/dL — ABNORMAL HIGH (ref 8–23)
CO2: 22 mmol/L (ref 22–32)
Calcium: 9 mg/dL (ref 8.9–10.3)
Chloride: 107 mmol/L (ref 98–111)
Creatinine, Ser: 1.46 mg/dL — ABNORMAL HIGH (ref 0.44–1.00)
GFR, Estimated: 40 mL/min — ABNORMAL LOW (ref >60)
Glucose, Bld: 118 mg/dL — ABNORMAL HIGH (ref 70–99)
Potassium: 4.3 mmol/L (ref 3.5–5.1)
Sodium: 137 mmol/L (ref 135–145)
Total Bilirubin: 0.6 mg/dL (ref 0.3–1.2)
Total Protein: 6.7 g/dL (ref 6.5–8.1)

## 2021-05-23 LAB — CBC WITH DIFFERENTIAL/PLATELET
Abs Immature Granulocytes: 0.02 10*3/uL (ref 0.00–0.07)
Basophils Absolute: 0 10*3/uL (ref 0.0–0.1)
Basophils Relative: 1 %
Eosinophils Absolute: 0.2 10*3/uL (ref 0.0–0.5)
Eosinophils Relative: 3 %
HCT: 40.4 % (ref 36.0–46.0)
Hemoglobin: 13.7 g/dL (ref 12.0–15.0)
Immature Granulocytes: 0 %
Lymphocytes Relative: 28 %
Lymphs Abs: 2 10*3/uL (ref 0.7–4.0)
MCH: 30.4 pg (ref 26.0–34.0)
MCHC: 33.9 g/dL (ref 30.0–36.0)
MCV: 89.6 fL (ref 80.0–100.0)
Monocytes Absolute: 0.6 10*3/uL (ref 0.1–1.0)
Monocytes Relative: 9 %
Neutro Abs: 4.1 10*3/uL (ref 1.7–7.7)
Neutrophils Relative %: 59 %
Platelets: 198 10*3/uL (ref 150–400)
RBC: 4.51 MIL/uL (ref 3.87–5.11)
RDW: 15.1 % (ref 11.5–15.5)
WBC: 6.9 10*3/uL (ref 4.0–10.5)
nRBC: 0 % (ref 0.0–0.2)

## 2021-05-23 LAB — HEPATITIS PANEL, ACUTE
HCV Ab: NONREACTIVE
Hep A IgM: NONREACTIVE
Hep B C IgM: NONREACTIVE
Hepatitis B Surface Ag: NONREACTIVE

## 2021-05-26 LAB — QUANTIFERON-TB GOLD PLUS (RQFGPL)
QuantiFERON Mitogen Value: >10 IU/mL
QuantiFERON Nil Value: 0.02 IU/mL
QuantiFERON TB1 Ag Value: 0.02 IU/mL
QuantiFERON TB2 Ag Value: 0.02 IU/mL

## 2021-05-26 LAB — QUANTIFERON-TB GOLD PLUS: QuantiFERON-TB Gold Plus: NEGATIVE

## 2021-06-19 NOTE — Progress Notes (Incomplete)
06/19/21 ?1:43 PM  ? ?Melanie Hubbard ?1955-08-06 ?[id number] ? ?Referring provider:  ?[facility] ?[address] ?[place],  [place] [address] ?No chief complaint on file. ? ? ?Urological history: ?History of right kidney stone  ?-10/14/2020 CT revealed indicating a large 2.3 cm right renal pelvic stone with out hydronephrosis ?- S/p right percutaneous nephrolithotomy on 12/17/2020 with Dr Erlene Quan  ?- RUS on 02/06/2021 visualized no significant sonographic abnormality of the kidneys  ?- Stone analysis revealed 75% calcium oxalate monohydrate, 20% calcium oxalate dihydrate, and 5% hydroxyapatite.  ? ?HPI: ?Melanie Hubbard is a 66 y.o.female who presents today for further evaluation of lower right back pain.  ? ? ? ? ? ?PMH: ?Past Medical History:  ?Diagnosis Date  ? Anxiety   ? Asthma   ? Cardiac arrest [facility]) 2004  ? Pt states "it took them 14 minutes to get me back"  ? Cervical cancer ([place]) 1990  ? Complication of anesthesia   ? woke up during oral surgery and c-section  ? COPD (chronic obstructive pulmonary disease) ([place])   ? Depression   ? History of kidney stones   ? Hypertension   ? Lupus ([place])   ? Rheumatoid arthritis ([place])   ? Swallowing difficulty   ? due to having esophageal polyps removed and pt states the surgeon messed her swallowing up  ? Vertigo   ? ? ?Surgical History: ?Past Surgical History:  ?Procedure Laterality Date  ? CATARACT EXTRACTION Right   ? CERVICAL ABLATION  1990  ? freeze/burn laser due to cervical cancer  ? CERVICAL FUSION    ? PLATE  ? [place]  ? CHOLECYSTECTOMY    ? HEEL SPUR SURGERY    ? IR URETERAL STENT RIGHT NEW ACCESS W/O SEP NEPHROSTOMY CATH  12/17/2020  ? LUMBAR FUSION    ? L4-L5 screws and rod  ? NEPHROLITHOTOMY Right 12/17/2020  ? Procedure: NEPHROLITHOTOMY PERCUTANEOUS;  Surgeon: Hollice Espy, MD;  Location: [facility] ORS;  Service: Urology;  Laterality: Right;  ? ROTATOR CUFF REPAIR Left   ? ? ?Home Medications:  ?Allergies as of  06/20/2021   ? ?   Reactions  ? Latex Itching  ? ?  ? ?  ?Medication List  ?  ? ?  ? Accurate as of June 19, 2021  1:43 PM. If you have any questions, ask your nurse or doctor.  ?  ?  ? ?  ? ?albuterol (2.5 MG/3ML) 0.083% nebulizer solution ?Commonly known as: PROVENTIL ?Take 2.5 mg by nebulization every 6 (six) hours as needed for wheezing or shortness of breath. ?  ?ALPRAZolam 1 MG tablet ?Commonly known as: Duanne Moron ?Take 1 mg by mouth as needed for anxiety. ?  ?clonazePAM 1 MG tablet ?Commonly known as: KLONOPIN ?Take 1 mg by mouth 2 (two) times daily as needed for anxiety. ?  ?Combivent Respimat 20-100 MCG/ACT Aers respimat ?Generic drug: Ipratropium-Albuterol ?Inhale 1 puff into the lungs every 6 (six) hours as needed for wheezing or shortness of breath. ?  ?cyclobenzaprine 10 MG tablet ?Commonly known as: FLEXERIL ?Take 10 mg by mouth in the morning and at bedtime. ?  ?enalapril 20 MG tablet ?Commonly known as: VASOTEC ?Take 20 mg by mouth daily as needed (elevated blood pressure due to pain/stress). ?  ?gabapentin 600 MG tablet ?Commonly known as: NEURONTIN ?Take 600 mg by mouth 3 (three) times daily as needed (nerve pain.). ?  ?Oxycodone HCl 20 MG Tabs ?Take 20 mg by  mouth 4 (four) times daily as needed (pain.). ?  ?OXYGEN ?Inhale 4 L into the lungs as needed (breathing issues.). ?  ? ?  ? ? ?Allergies:  ?Allergies  ?Allergen Reactions  ? Latex Itching  ? ? ?Family History: ?Family History  ?Adopted: Yes  ?Family history unknown: Yes  ? ? ?Social History:  reports that she has been smoking cigarettes. She has been smoking an average of .25 packs per day. She has never used smokeless tobacco. She reports current alcohol use. She reports current drug use. Drugs: Marijuana and Cocaine. ? ? ?Physical Exam: ?There were no vitals taken for this visit.  ?Constitutional:  Alert and oriented, No acute distress. ?HEENT: [place] AT, moist mucus membranes.  Trachea midline, no masses. ?Cardiovascular: No clubbing, cyanosis, or  edema. ?Respiratory: Normal respiratory effort, no increased work of breathing. ?GI: Abdomen is soft, nontender, nondistended, no abdominal masses ?GU: No CVA tenderness ?Lymph: No cervical or inguinal lymphadenopathy. ?Skin: No rashes, bruises or suspicious lesions. ?Neurologic: Grossly intact, no focal deficits, moving all 4 extremities. ?Psychiatric: Normal mood and affect. ? ? ?Laboratory Data: ? ?Lab Results  ?Component Value Date  ? CREATININE 1.46 (H) 05/23/2021  ? ?Component ?    Latest Ref Rng 05/23/2021  ?Sodium ?    135 - 145 mmol/L 137   ?Potassium ?    3.5 - 5.1 mmol/L 4.3   ?Chloride ?    98 - 111 mmol/L 107   ?CO2 ?    22 - 32 mmol/L 22   ?Glucose ?    70 - 99 mg/dL 118 (H)   ?BUN ?    8 - 23 mg/dL 31 (H)   ?Creatinine ?    0.44 - 1.00 mg/dL 1.46 (H)   ?Calcium ?    8.9 - 10.3 mg/dL 9.0   ?GFR, Estimated ?    >60 mL/min 40 (L)   ?Anion gap ?    5 - 15  8   ?Total Protein ?    6.5 - 8.1 g/dL 6.7   ?Albumin ?    3.5 - 5.0 g/dL 3.9   ?AST ?    15 - 41 U/L 15   ?ALT ?    0 - 44 U/L 14   ?Alkaline Phosphatase ?    38 - 126 U/L 58   ?Total Bilirubin ?    0.3 - 1.2 mg/dL 0.6   ?  ?(H) High ?(L) Low ?Urinalysis ? ? ?Pertinent Imaging: ? ? ?Assessment & Plan:   ? ? ?No follow-ups on file. ? ?[facility] ?[address] ?[place], [place] [address] ?[phone] ? ?I,Kailey Littlejohn,acting as a Education administrator for [employer], PA-C.,have documented all relevant documentation on the behalf of [place], PA-C,as directed by  [facility], PA-C while in the presence of [place], PA-C. ? ?

## 2021-06-20 ENCOUNTER — Ambulatory Visit: Payer: Medicaid Other | Admitting: Urology

## 2021-06-20 ENCOUNTER — Encounter: Payer: Self-pay | Admitting: Urology

## 2021-06-20 ENCOUNTER — Other Ambulatory Visit: Payer: Self-pay

## 2021-07-11 ENCOUNTER — Emergency Department
Admission: EM | Admit: 2021-07-11 | Discharge: 2021-07-11 | Disposition: A | Discharge status: Home / Self Care | Payer: Medicaid Other | Attending: Emergency Medicine | Admitting: Emergency Medicine

## 2021-07-11 ENCOUNTER — Other Ambulatory Visit: Payer: Self-pay

## 2021-07-11 DIAGNOSIS — F419 Anxiety disorder, unspecified: Secondary | ICD-10-CM | POA: Insufficient documentation

## 2021-07-11 DIAGNOSIS — F1320 Sedative, hypnotic or anxiolytic dependence, uncomplicated: Secondary | ICD-10-CM | POA: Insufficient documentation

## 2021-07-11 MED ORDER — CLONAZEPAM 0.5 MG PO TABS: 0.5000 mg | ORAL_TABLET | Freq: Every day | ORAL | 0 refills | Status: AC | PRN

## 2021-07-11 MED ORDER — CLONAZEPAM 0.5 MG PO TABS
0.5000 mg | ORAL_TABLET | Freq: Once | ORAL | Status: AC
  Administered 2021-07-11: 0.5 mg via ORAL
  Filled 2021-07-11: qty 1

## 2021-07-11 NOTE — ED Provider Notes (Signed)
? ?[facility] ?Provider Note ? ? ? Event Date/Time  ? First MD Initiated Contact with Patient 07/11/21 [phone]   ?  (approximate) ? ? ?History  ? ?Anxiety ? ? ?HPI ? ?Melanie Hubbard is a 66 y.o. female who presents to the ED for evaluation of Anxiety ?  ?Review outpatient urgent care visit from 2/27 where patient was evaluated for request for refill of her clonazepam. ? ?Patient presents to the ED requesting a refill of her benzodiazepines as she is awaiting outpatient psychiatric clinic visits on 4/27.  She reports she "finally gotten" to see a psychiatrist later this month, facilitated by [facility]. ? ?Reports that she has been stretching her previous 5 tablets of clonazepam for the past 2 months, but has long since run out.  She reports increasing home psychosocial stressors, coming to ahead this morning with an argument with her boyfriend. ? ?She denies any suicidal intent, overdose or buying any drugs from the streets.  Reports that she does use occasional recreational cannabis.  ? ?Physical Exam  ? ?Triage Vital Signs: ?ED Triage Vitals  ?Enc Vitals Group  ?   BP 07/11/21 0712 (!) 145/86  ?   Pulse Rate 07/11/21 0712 71  ?   Resp 07/11/21 0712 17  ?   Temp 07/11/21 0712 98.7 ?F (37.1 ?C)  ?   Temp Source 07/11/21 0712 Oral  ?   SpO2 07/11/21 0712 97 %  ?   Weight --   ?   Height 07/11/21 0713 5' (1.524 m)  ?   Head Circumference --   ?   Peak Flow --   ?   Pain Score 07/11/21 0712 4  ?   Pain Loc --   ?   Pain Edu? --   ?   Excl. in [place]? --   ? ? ?Most recent vital signs: ?Vitals:  ? 07/11/21 0712  ?BP: (!) 145/86  ?Pulse: 71  ?Resp: 17  ?Temp: 98.7 ?F (37.1 ?C)  ?SpO2: 97%  ? ? ?General: Awake, no distress.  Obese.  Tearful when discussing her home situation.  She does have linear thought processes.  Ambulatory with normal gait. ?CV:  Good peripheral perfusion.  ?Resp:  Normal effort.  ?Abd:  No distention.  ?MSK:  No deformity noted.  ?Neuro:  No focal deficits appreciated. ?Other:   ? ? ?ED  Results / Procedures / Treatments  ? ?Labs ?(all labs ordered are listed, but only abnormal results are displayed) ?Labs Reviewed - No data to display ? ?EKG ? ? ?RADIOLOGY ? ? ?Official radiology report(s): ?No results found. ? ?PROCEDURES and INTERVENTIONS: ? ?Procedures ? ?Medications  ?clonazePAM (KLONOPIN) tablet 0.5 mg (0.5 mg Oral Given 07/11/21 0749)  ? ? ? ?IMPRESSION / MDM / ASSESSMENT AND PLAN / ED COURSE  ?I reviewed the triage vital signs and the nursing notes. ? ?66 year old woman presents to the ED with severe anxiety suitable for outpatient management and psychiatric follow-up.  She is no evidence of psychiatric emergency to necessitate IVC or emergent evaluation by psychiatry.  No signs of toxidromes, overdose, neurologic or vascular deficits.  No signs of trauma.  Provided half dose of her clonazepam here in the ED and discharged with a prescription for 4 more tablets of this lesser dose.  We discussed the importance of not missing his medications with ethanol and we discussed appropriate return precautions for the ED.   ? ?Clinical Course as of 07/11/21 0839  ?Thu Jul 11, 2021  ?  73 .  Reviewed PDMP.  5 tablets of clonazepam on 2/27, 1 mg each [DS]  ?  ?Clinical Course User Index ?[DS] Vladimir Crofts, MD  ? ? ? ?FINAL CLINICAL IMPRESSION(S) / ED DIAGNOSES  ? ?Final diagnoses:  ?Anxiety  ?Benzodiazepine dependence ([place])  ? ? ? ?Rx / DC Orders  ? ?ED Discharge Orders   ? ?      Ordered  ?  clonazePAM (KLONOPIN) 0.5 MG tablet  Daily PRN       ? 07/11/21 0744  ? ?  ?  ? ?  ? ? ? ?Note:  This document was prepared using Dragon voice recognition software and may include unintentional dictation errors. ?  ?Vladimir Crofts, MD ?07/11/21 0840 ? ?

## 2021-07-11 NOTE — ED Notes (Signed)
Pt here for Klonopin prescription. Pt unable to find primary provider in this area. ?

## 2021-07-11 NOTE — ED Notes (Signed)
This RN reviewed paperwork with pt. No further complaints or questions. Pt ambulated to lobby. Discharge form placed in med rec box.  

## 2021-07-11 NOTE — ED Triage Notes (Signed)
Pt c/o having a lot of panic attack, anxiety, Denies SI/HI. States she has been to [facility]. States she is wanting a Rx for klonopin.  ?

## 2021-07-15 ENCOUNTER — Other Ambulatory Visit: Payer: Self-pay

## 2021-07-15 ENCOUNTER — Emergency Department
Admission: EM | Admit: 2021-07-15 | Discharge: 2021-07-15 | Disposition: A | Discharge status: Home / Self Care | Payer: Medicaid Other | Attending: Emergency Medicine | Admitting: Emergency Medicine

## 2021-07-15 DIAGNOSIS — Z760 Encounter for issue of repeat prescription: Secondary | ICD-10-CM | POA: Diagnosis present

## 2021-07-15 DIAGNOSIS — Z5321 Procedure and treatment not carried out due to patient leaving prior to being seen by health care provider: Secondary | ICD-10-CM | POA: Insufficient documentation

## 2021-07-15 NOTE — ED Notes (Signed)
EDP at bedside  

## 2021-07-15 NOTE — ED Provider Notes (Signed)
? ?[facility] ?Provider Note ? ? ? Event Date/Time  ? First MD Initiated Contact with Patient 07/15/21 [phone]   ?  (approximate) ? ? ?History  ? ?Medication Refill ? ? ?HPI ? ?Melanie Hubbard is a 66 y.o. female whose medical history includes polysubstance abuse, opioid dependence due to chronic pain syndrome, and benzodiazepine dependence.  She presents this morning requesting a benzodiazepine (Klonopin) refill.  She said that she has been in [place] for 2 years having moved down from [place] and she has been unable to establish a doctor to write her prescriptions.  She came to this emergency department 4 to 5 days ago and was given a small prescription.  She said that she has an appointment with a psychiatrist in 10 days and has already been [facility] one time.  She states she just needs more pills to get her through to her psychiatry appointment. ? ?She denies any other symptoms. ?  ? ? ?Physical Exam  ? ?Triage Vital Signs: ?ED Triage Vitals  ?Enc Vitals Group  ?   BP 07/15/21 0602 (!) 147/100  ?   Pulse Rate 07/15/21 0602 74  ?   Resp 07/15/21 0602 16  ?   Temp 07/15/21 0602 98.1 ?F (36.7 ?C)  ?   Temp Source 07/15/21 0602 Oral  ?   SpO2 07/15/21 0602 98 %  ?   Weight 07/15/21 0553 90.7 kg (200 lb)  ?   Height 07/15/21 0553 1.524 m (5')  ?   Head Circumference --   ?   Peak Flow --   ?   Pain Score 07/15/21 0553 0  ?   Pain Loc --   ?   Pain Edu? --   ?   Excl. in [place]? --   ? ? ?Most recent vital signs: ?Vitals:  ? 07/15/21 0602  ?BP: (!) 147/100  ?Pulse: 74  ?Resp: 16  ?Temp: 98.1 ?F (36.7 ?C)  ?SpO2: 98%  ? ? ? ?General: Awake, no distress.  ?CV:  Good peripheral perfusion.  ?Resp:  Normal effort.  No accessory muscle usage. ?Abd:  No distention.  ?MSK:  Ambulating without difficulty. ? ? ?ED Results / Procedures / Treatments  ? ?IMPRESSION / MDM / ASSESSMENT AND PLAN / ED COURSE  ?I reviewed the triage vital signs and the nursing notes. ?             ?               ? ?Differential  diagnosis includes, but is not limited to, benzodiazepine dependence, lack of primary care or psychiatric care, secondary gain. ? ?I reviewed the patient's medical record including a pain management note from Dr. Dossie Arbour on 04/17/2021, the urgent care visit from 05/27/2021, and the emergency department visit from Dr. Vladimir Crofts from 4 days ago.  I also reviewed the controlled substance database from [place] and see that she has received 3 prescriptions for benzodiazepines in the last 3 months from different providers.  In addition to the documented history of cocaine abuse and her problem list, Dr. Dossie Arbour specifically mentions her history of polysubstance abuse in his note.  It was also documented in the note from February that the patient has been trying to get an outpatient doctor and that she had plans to do so in about a month. ? ?I explained to the patient that I am very sorry for her situation, but that the emergency department cannot continue to provide  prescriptions for benzodiazepines.  I explained that it is also my personal practice to not provide these medications and that the [facility] health policy is to not provide controlled substance prescriptions for ongoing chronic issues.  She was understandably upset and argued that other providers have done so in the past but I explained that that will not continue moving forward.  I explained that she is free to seek prescriptions at other facilities but that we would not provide additional prescriptions and that she can follow-up at [facility] this morning at 8 AM or any other facility of her choice.  She eloped without awaiting discharge paperwork. ? ? ? ?  ? ? ?FINAL CLINICAL IMPRESSION(S) / ED DIAGNOSES  ? ?Final diagnoses:  ?Medication refill  ? ? ? ?Rx / DC Orders  ? ?ED Discharge Orders   ? ? None  ? ?  ? ? ? ?Note:  This document was prepared using Dragon voice recognition software and may include unintentional dictation errors. ?  ?Hinda Kehr,  MD ?07/15/21 0710 ? ?

## 2021-07-15 NOTE — ED Notes (Signed)
Pt requesting klonopin refill. States she got 4 prescribed here in ED recently and has made follow-up appt as requested towards end of month. ?

## 2021-07-15 NOTE — ED Notes (Addendum)
Pt obviously aggravated after conversation with EDP. Pt left without announcing that she was leaving to RN.  ?EDP was aware that pt stated she was leaving. ?

## 2021-07-15 NOTE — ED Triage Notes (Signed)
Pt states she needs a prescription for klonopin. Pt states she is supposed to take '1mg'$  3-4 times a day. Pt appears in no acute distress.  ?

## 2021-09-26 ENCOUNTER — Encounter: Payer: Self-pay | Admitting: Emergency Medicine

## 2021-09-26 ENCOUNTER — Emergency Department: Payer: Medicaid Other

## 2021-09-26 ENCOUNTER — Other Ambulatory Visit: Payer: Self-pay

## 2021-09-26 ENCOUNTER — Emergency Department
Admission: EM | Admit: 2021-09-26 | Discharge: 2021-09-26 | Disposition: A | Discharge status: Home / Self Care | Payer: Medicaid Other | Attending: Emergency Medicine | Admitting: Emergency Medicine

## 2021-09-26 DIAGNOSIS — T148XXA Other injury of unspecified body region, initial encounter: Secondary | ICD-10-CM

## 2021-09-26 DIAGNOSIS — Z79899 Other long term (current) drug therapy: Secondary | ICD-10-CM | POA: Diagnosis not present

## 2021-09-26 DIAGNOSIS — S8991XA Unspecified injury of right lower leg, initial encounter: Secondary | ICD-10-CM | POA: Diagnosis present

## 2021-09-26 DIAGNOSIS — Z91040 Latex allergy status: Secondary | ICD-10-CM | POA: Diagnosis not present

## 2021-09-26 DIAGNOSIS — J45909 Unspecified asthma, uncomplicated: Secondary | ICD-10-CM | POA: Insufficient documentation

## 2021-09-26 DIAGNOSIS — Z8541 Personal history of malignant neoplasm of cervix uteri: Secondary | ICD-10-CM | POA: Diagnosis not present

## 2021-09-26 DIAGNOSIS — Z7951 Long term (current) use of inhaled steroids: Secondary | ICD-10-CM | POA: Insufficient documentation

## 2021-09-26 DIAGNOSIS — J449 Chronic obstructive pulmonary disease, unspecified: Secondary | ICD-10-CM | POA: Diagnosis not present

## 2021-09-26 DIAGNOSIS — S8001XA Contusion of right knee, initial encounter: Secondary | ICD-10-CM | POA: Diagnosis not present

## 2021-09-26 DIAGNOSIS — W19XXXA Unspecified fall, initial encounter: Secondary | ICD-10-CM | POA: Insufficient documentation

## 2021-09-26 DIAGNOSIS — M25561 Pain in right knee: Secondary | ICD-10-CM

## 2021-09-26 MED ORDER — KETOROLAC TROMETHAMINE 60 MG/2ML IM SOLN
30.0000 mg | Freq: Once | INTRAMUSCULAR | Status: AC
  Administered 2021-09-26: 30 mg via INTRAMUSCULAR
  Filled 2021-09-26: qty 2

## 2021-09-26 MED ORDER — HYDROCODONE-ACETAMINOPHEN 5-325 MG PO TABS: 1.0000 | ORAL_TABLET | Freq: Four times a day (QID) | ORAL | 0 refills | Status: AC | PRN

## 2021-09-26 NOTE — ED Provider Notes (Signed)
[facility] Provider Note    Event Date/Time   First MD Initiated Contact with Patient 09/26/21 0533     (approximate)   History   Fall   HPI  Melanie Hubbard is a 66 y.o. female who presents to the ED from home with a chief complaint of left knee pain.  Patient had a mechanical fall 5 days ago, striking her left knee.  Denies striking head or LOC.  Denies anticoagulation.  Presents with pain on ambulation and swollen left knee.  Voices no other complaints or injuries.     Past Medical History   Past Medical History:  Diagnosis Date   Anxiety    Asthma    Cardiac arrest [facility]) 2004   Pt states "it took them 14 minutes to get me back"   Cervical cancer ([place]) 0102   Complication of anesthesia    woke up during oral surgery and c-section   COPD (chronic obstructive pulmonary disease) ([place])    Depression    History of kidney stones    Hypertension    Lupus ([place])    Rheumatoid arthritis ([place])    Swallowing difficulty    due to having esophageal polyps removed and pt states the surgeon messed her swallowing up   Vertigo      Active Problem List   Patient Active Problem List   Diagnosis Date Noted   Lupus ([place])    Rheumatoid arthritis involving multiple sites with positive rheumatoid factor (HCC)    Abnormal drug screen    History of cocaine use    History of cervical cancer    History of lumbar surgery    Chronic pain syndrome 04/15/2021   Pharmacologic therapy 04/15/2021   Disorder of skeletal system 04/15/2021   Problems influencing health status 04/15/2021   Right renal stone 12/17/2020   Depression with anxiety 11/10/2014     Past Surgical History   Past Surgical History:  Procedure Laterality Date   CATARACT EXTRACTION Right    CERVICAL ABLATION  1990   freeze/burn laser due to cervical cancer   CERVICAL FUSION     PLATE   CESAREAN SECTION     1980, 1994   CHOLECYSTECTOMY     HEEL SPUR SURGERY     IR URETERAL STENT  RIGHT NEW ACCESS W/O SEP NEPHROSTOMY CATH  12/17/2020   LUMBAR FUSION     L4-L5 screws and rod   NEPHROLITHOTOMY Right 12/17/2020   Procedure: NEPHROLITHOTOMY PERCUTANEOUS;  Surgeon: Hollice Espy, MD;  Location: [facility] ORS;  Service: Urology;  Laterality: Right;   ROTATOR CUFF REPAIR Left      Home Medications   Prior to Admission medications   Medication Sig Start Date End Date Taking? Authorizing Provider  HYDROcodone-acetaminophen (NORCO) 5-325 MG tablet Take 1 tablet by mouth every 6 (six) hours as needed for moderate pain. 09/26/21  Yes Paulette Blanch, MD  albuterol (PROVENTIL) (2.5 MG/3ML) 0.083% nebulizer solution Take 2.5 mg by nebulization every 6 (six) hours as needed for wheezing or shortness of breath.    [provider]  ALPRAZolam Duanne Moron) 1 MG tablet Take 1 mg by mouth as needed for anxiety. Patient not taking: Reported on 02/07/2021    [provider]  clonazePAM (KLONOPIN) 0.5 MG tablet Take 1 tablet (0.5 mg total) by mouth daily as needed for anxiety. 07/11/21 07/11/22  Vladimir Crofts, MD  clonazePAM (KLONOPIN) 1 MG tablet Take 1 mg by mouth 2 (two) times daily as needed for anxiety.  Patient not taking: No sig reported    [provider]  cyclobenzaprine (FLEXERIL) 10 MG tablet Take 10 mg by mouth in the morning and at bedtime.    [provider]  enalapril (VASOTEC) 20 MG tablet Take 20 mg by mouth daily as needed (elevated blood pressure due to pain/stress).    [provider]  gabapentin (NEURONTIN) 600 MG tablet Take 600 mg by mouth 3 (three) times daily as needed (nerve pain.).    [provider]  Ipratropium-Albuterol (COMBIVENT RESPIMAT) 20-100 MCG/ACT AERS respimat Inhale 1 puff into the lungs every 6 (six) hours as needed for wheezing or shortness of breath.    [provider]  Oxycodone HCl 20 MG TABS Take 20 mg by mouth 4 (four) times daily as needed (pain.).    [provider]  OXYGEN Inhale 4 L into  the lungs as needed (breathing issues.).    [provider]     Allergies  Latex   Family History   Family History  Adopted: Yes  Family history unknown: Yes     Physical Exam  Triage Vital Signs: ED Triage Vitals  Enc Vitals Group     BP 09/26/21 0450 100/68     Pulse Rate 09/26/21 0450 71     Resp 09/26/21 0450 18     Temp 09/26/21 0450 98.3 F (36.8 C)     Temp Source 09/26/21 0450 Oral     SpO2 09/26/21 0450 98 %     Weight 09/26/21 0446 190 lb (86.2 kg)     Height 09/26/21 0446 5' (1.524 m)     Head Circumference --      Peak Flow --      Pain Score 09/26/21 0446 6     Pain Loc --      Pain Edu? --      Excl. in [place]? --     Updated Vital Signs: BP 100/68 (BP Location: Left Arm)   Pulse 71   Temp 98.3 F (36.8 C) (Oral)   Resp 18   Ht 5' (1.524 m)   Wt 86.2 kg   SpO2 98%   BMI 37.11 kg/m    General: Awake, no distress.  CV:  Good peripheral perfusion.  Resp:  Normal effort.  Abd:  No distention.  Other:  Left knee with moderate swelling and old ecchymosis going down the leg into the calf.  Limited range of motion secondary to pain.  Calf remains supple; is not tense.  Plus distal pulses.  Brisk, less than 5-second capillary refill.   ED Results / Procedures / Treatments  Labs (all labs ordered are listed, but only abnormal results are displayed) Labs Reviewed - No data to display   EKG  None   RADIOLOGY I have independently visualized and interpreted patient's x-ray as well as noted the radiology interpretation:  Left knee x-ray: No acute fracture or dislocation  Official radiology report(s): DG Knee Complete 4 Views Left  Result Date: 09/26/2021 CLINICAL DATA:  Fall.  Pain EXAM: LEFT KNEE - COMPLETE 4+ VIEW COMPARISON:  None Available. FINDINGS: No evidence of acute displaced fracture, dislocation, or joint effusion. No evidence of arthropathy or other focal bone abnormality. Soft tissues are unremarkable. IMPRESSION: No acute  displaced fracture or dislocation within LEFT knee Electronically Signed   By: Michaelle Birks M.D.   On: 09/26/2021 05:05     PROCEDURES:  Critical Care performed: No  Procedures   MEDICATIONS ORDERED IN ED: Medications  ketorolac (TORADOL) injection 30 mg (has no administration in time range)     IMPRESSION / MDM / ASSESSMENT AND PLAN / ED COURSE  I reviewed the triage vital signs and the nursing notes.                             66 year old female who presents 5 days status post mechanical fall with left knee pain and swelling.  Will place in knee immobilizer, provide walker for balance as she ambulates.  Patient drove to the emergency department; will administer IM Toradol now.  Will discharge home with as needed Norco prescription and patient will follow-up closely with orthopedics.  Strict return precautions given.  Patient verbalizes understanding and agrees with plan of care.   FINAL CLINICAL IMPRESSION(S) / ED DIAGNOSES   Final diagnoses:  Fall, initial encounter  Acute pain of right knee  Hematoma     Rx / DC Orders   ED Discharge Orders          Ordered    HYDROcodone-acetaminophen (NORCO) 5-325 MG tablet  Every 6 hours PRN        09/26/21 0557             Note:  This document was prepared using Dragon voice recognition software and may include unintentional dictation errors.   Paulette Blanch, MD 09/26/21 [phone]

## 2021-09-26 NOTE — Discharge Instructions (Addendum)
1.  You may take Ibuprofen as needed for pain; Norco as needed for more severe pain. 2.  Wear knee immobilizer during the day and use walker to help you balance as you walk. 3.  Elevate left knee and apply ice several times daily to reduce swelling. 4.  Return to the ER for worsening symptoms, persistent vomiting, difficulty breathing or other concerns.

## 2021-09-26 NOTE — ED Triage Notes (Signed)
Patient ambulatory to triage with steady gait, without difficulty or distress noted; pt reports falling on left knee on Sunday, c/o persistent pain

## 2021-12-31 ENCOUNTER — Ambulatory Visit: Payer: Medicaid Other | Admitting: Podiatry

## 2022-01-07 ENCOUNTER — Ambulatory Visit: Payer: Medicaid Other | Admitting: Podiatry

## 2022-01-19 ENCOUNTER — Emergency Department: Payer: Medicaid Other

## 2022-01-19 ENCOUNTER — Other Ambulatory Visit: Payer: Self-pay

## 2022-01-19 ENCOUNTER — Emergency Department
Admission: EM | Admit: 2022-01-19 | Discharge: 2022-01-19 | Discharge status: Against Medical Advice | Payer: Medicaid Other | Attending: Emergency Medicine | Admitting: Emergency Medicine

## 2022-01-19 DIAGNOSIS — Z5321 Procedure and treatment not carried out due to patient leaving prior to being seen by health care provider: Secondary | ICD-10-CM | POA: Diagnosis not present

## 2022-01-19 DIAGNOSIS — R059 Cough, unspecified: Secondary | ICD-10-CM | POA: Diagnosis not present

## 2022-01-19 DIAGNOSIS — R531 Weakness: Secondary | ICD-10-CM | POA: Insufficient documentation

## 2022-01-19 DIAGNOSIS — R0602 Shortness of breath: Secondary | ICD-10-CM | POA: Diagnosis not present

## 2022-01-19 LAB — CBC
HCT: 44.5 % (ref 36.0–46.0)
Hemoglobin: 15.7 g/dL — ABNORMAL HIGH (ref 12.0–15.0)
MCH: 32.5 pg (ref 26.0–34.0)
MCHC: 35.3 g/dL (ref 30.0–36.0)
MCV: 92.1 fL (ref 80.0–100.0)
Platelets: 188 10*3/uL (ref 150–400)
RBC: 4.83 MIL/uL (ref 3.87–5.11)
RDW: 15 % (ref 11.5–15.5)
WBC: 5.8 10*3/uL (ref 4.0–10.5)
nRBC: 0 % (ref 0.0–0.2)

## 2022-01-19 LAB — TROPONIN I (HIGH SENSITIVITY): Troponin I (High Sensitivity): 16 ng/L (ref <18)

## 2022-01-19 NOTE — ED Triage Notes (Signed)
Pt called for lab recollect, no response

## 2022-01-19 NOTE — ED Triage Notes (Signed)
Pt to ED via POV from home. Pt reports SOB x5 days. Pt reports increased weakness and dry cough. Pt reports wears oxygen at home but has not had tubing since moving. Oxygen 91-92%. Pt placed on 2L [place] for comfort.

## 2022-01-19 NOTE — ED Triage Notes (Signed)
Pt called x's 3, no response ?

## 2022-02-11 ENCOUNTER — Other Ambulatory Visit: Payer: Self-pay

## 2022-02-11 DIAGNOSIS — N200 Calculus of kidney: Secondary | ICD-10-CM

## 2022-02-12 ENCOUNTER — Ambulatory Visit: Payer: Medicaid Other | Admitting: Urology

## 2022-03-05 ENCOUNTER — Ambulatory Visit: Payer: Medicaid Other | Admitting: Urology

## 2022-03-11 ENCOUNTER — Encounter: Payer: Self-pay | Admitting: Urology

## 2022-11-01 ENCOUNTER — Emergency Department
Admission: EM | Admit: 2022-11-01 | Discharge: 2022-11-01 | Disposition: A | Discharge status: Home / Self Care | Payer: Medicaid - Out of State | Attending: Emergency Medicine | Admitting: Emergency Medicine

## 2022-11-01 ENCOUNTER — Other Ambulatory Visit: Payer: Self-pay

## 2022-11-01 DIAGNOSIS — G8929 Other chronic pain: Secondary | ICD-10-CM | POA: Insufficient documentation

## 2022-11-01 DIAGNOSIS — Z79899 Other long term (current) drug therapy: Secondary | ICD-10-CM | POA: Insufficient documentation

## 2022-11-01 DIAGNOSIS — J449 Chronic obstructive pulmonary disease, unspecified: Secondary | ICD-10-CM | POA: Diagnosis not present

## 2022-11-01 DIAGNOSIS — I10 Essential (primary) hypertension: Secondary | ICD-10-CM | POA: Diagnosis not present

## 2022-11-01 DIAGNOSIS — M5442 Lumbago with sciatica, left side: Secondary | ICD-10-CM | POA: Insufficient documentation

## 2022-11-01 DIAGNOSIS — Z91040 Latex allergy status: Secondary | ICD-10-CM | POA: Diagnosis not present

## 2022-11-01 DIAGNOSIS — Z8541 Personal history of malignant neoplasm of cervix uteri: Secondary | ICD-10-CM | POA: Insufficient documentation

## 2022-11-01 DIAGNOSIS — M5441 Lumbago with sciatica, right side: Secondary | ICD-10-CM | POA: Insufficient documentation

## 2022-11-01 DIAGNOSIS — M549 Dorsalgia, unspecified: Secondary | ICD-10-CM | POA: Diagnosis present

## 2022-11-01 DIAGNOSIS — F17210 Nicotine dependence, cigarettes, uncomplicated: Secondary | ICD-10-CM | POA: Diagnosis not present

## 2022-11-01 DIAGNOSIS — J45909 Unspecified asthma, uncomplicated: Secondary | ICD-10-CM | POA: Diagnosis not present

## 2022-11-01 MED ORDER — OXYCODONE-ACETAMINOPHEN 5-325 MG PO TABS
1.0000 | ORAL_TABLET | Freq: Four times a day (QID) | ORAL | 0 refills | Status: AC | PRN
Start: 2022-11-01 — End: 2022-11-06

## 2022-11-01 MED ORDER — PREDNISONE 20 MG PO TABS
60.0000 mg | ORAL_TABLET | Freq: Once | ORAL | Status: AC
  Administered 2022-11-01: 60 mg via ORAL
  Filled 2022-11-01: qty 3

## 2022-11-01 MED ORDER — ENALAPRIL MALEATE 20 MG PO TABS
20.0000 mg | ORAL_TABLET | Freq: Every day | ORAL | 0 refills | Status: AC
Start: 2022-11-01 — End: 2022-12-01

## 2022-11-01 MED ORDER — OXYCODONE-ACETAMINOPHEN 5-325 MG PO TABS
1.0000 | ORAL_TABLET | Freq: Once | ORAL | Status: AC
  Administered 2022-11-01: 1 via ORAL
  Filled 2022-11-01: qty 1

## 2022-11-01 MED ORDER — PREDNISONE 10 MG (21) PO TBPK
ORAL_TABLET | ORAL | 0 refills | Status: AC
Start: 2022-11-01 — End: ?

## 2022-11-01 MED ORDER — ENALAPRIL MALEATE 10 MG PO TABS
20.0000 mg | ORAL_TABLET | Freq: Once | ORAL | Status: AC
  Administered 2022-11-01: 20 mg via ORAL
  Filled 2022-11-01: qty 2
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
## 2022-11-01 NOTE — Discharge Instructions (Signed)
You have been seen today in the emergency room for chronic low back pain with sciatic. You are currently out of your pain medication which she received in [place].  I will prescribe you with a 5-day supply of oxycodone.  Because of the sciatic component of the back pain that has flared, I will provide you with a prednisone taper back that should help alleviate this.  After that we you will need to follow-up with your providers in [place].  You have also been out of your blood pressure medicine for the last month.  You have been provided a dose in the emergency room today.  I will provide you with a 30-day supply until you can return to [place]. I recommend that you contact your back surgeon in [place] for a follow-up plan.
